# Patient Record
Sex: Female | Born: 1970 | Race: White | Hispanic: No | Marital: Married | Smoking: Never smoker
Health system: Southern US, Community
[De-identification: ages and names within clinical notes are randomized; demographics above are authoritative.]

---

## 2014-08-01 ENCOUNTER — Encounter (HOSPITAL_COMMUNITY): Payer: Self-pay | Admitting: Emergency Medicine

## 2014-08-01 ENCOUNTER — Emergency Department (HOSPITAL_COMMUNITY): Payer: Self-pay

## 2014-08-01 ENCOUNTER — Emergency Department (HOSPITAL_COMMUNITY)
Admission: EM | Admit: 2014-08-01 | Discharge: 2014-08-03 | Disposition: A | Discharge status: Other Acute Inpatient Hospital | Payer: Self-pay | Attending: Emergency Medicine | Admitting: Emergency Medicine

## 2014-08-01 DIAGNOSIS — F419 Anxiety disorder, unspecified: Secondary | ICD-10-CM | POA: Insufficient documentation

## 2014-08-01 DIAGNOSIS — Y998 Other external cause status: Secondary | ICD-10-CM | POA: Insufficient documentation

## 2014-08-01 DIAGNOSIS — Y9389 Activity, other specified: Secondary | ICD-10-CM | POA: Insufficient documentation

## 2014-08-01 DIAGNOSIS — F309 Manic episode, unspecified: Secondary | ICD-10-CM

## 2014-08-01 DIAGNOSIS — S52611A Displaced fracture of right ulna styloid process, initial encounter for closed fracture: Secondary | ICD-10-CM | POA: Insufficient documentation

## 2014-08-01 DIAGNOSIS — Y9289 Other specified places as the place of occurrence of the external cause: Secondary | ICD-10-CM | POA: Insufficient documentation

## 2014-08-01 DIAGNOSIS — F29 Unspecified psychosis not due to a substance or known physiological condition: Secondary | ICD-10-CM | POA: Insufficient documentation

## 2014-08-01 DIAGNOSIS — X58XXXA Exposure to other specified factors, initial encounter: Secondary | ICD-10-CM | POA: Insufficient documentation

## 2014-08-01 LAB — URINALYSIS, ROUTINE W REFLEX MICROSCOPIC
Bilirubin Urine: NEGATIVE
Glucose, UA: >1000 mg/dL — AB
Ketones, ur: NEGATIVE mg/dL
LEUKOCYTES UA: NEGATIVE
NITRITE: NEGATIVE
SPECIFIC GRAVITY, URINE: 1.02 (ref 1.005–1.030)
Urobilinogen, UA: 0.2 mg/dL (ref 0.0–1.0)
pH: 6 (ref 5.0–8.0)

## 2014-08-01 LAB — RAPID URINE DRUG SCREEN, HOSP PERFORMED
AMPHETAMINES: NOT DETECTED
BENZODIAZEPINES: NOT DETECTED
Barbiturates: NOT DETECTED
Cocaine: NOT DETECTED
Opiates: NOT DETECTED
TETRAHYDROCANNABINOL: POSITIVE — AB

## 2014-08-01 LAB — URINE MICROSCOPIC-ADD ON

## 2014-08-01 LAB — CBC WITH DIFFERENTIAL/PLATELET
Basophils Absolute: 0 10*3/uL (ref 0.0–0.1)
Basophils Relative: 0 % (ref 0–1)
EOS ABS: 0 10*3/uL (ref 0.0–0.7)
Eosinophils Relative: 0 % (ref 0–5)
HCT: 38.2 % (ref 36.0–46.0)
Hemoglobin: 12.8 g/dL (ref 12.0–15.0)
LYMPHS PCT: 11 % — AB (ref 12–46)
Lymphs Abs: 1.6 10*3/uL (ref 0.7–4.0)
MCH: 30.3 pg (ref 26.0–34.0)
MCHC: 33.5 g/dL (ref 30.0–36.0)
MCV: 90.5 fL (ref 78.0–100.0)
Monocytes Absolute: 0.6 10*3/uL (ref 0.1–1.0)
Monocytes Relative: 5 % (ref 3–12)
NEUTROS PCT: 84 % — AB (ref 43–77)
Neutro Abs: 11.7 10*3/uL — ABNORMAL HIGH (ref 1.7–7.7)
PLATELETS: 291 10*3/uL (ref 150–400)
RBC: 4.22 MIL/uL (ref 3.87–5.11)
RDW: 13.5 % (ref 11.5–15.5)
WBC: 13.9 10*3/uL — ABNORMAL HIGH (ref 4.0–10.5)

## 2014-08-01 LAB — COMPREHENSIVE METABOLIC PANEL
ALBUMIN: 3.8 g/dL (ref 3.5–5.2)
ALK PHOS: 98 U/L (ref 39–117)
ALT: 22 U/L (ref 0–35)
ANION GAP: 6 (ref 5–15)
AST: 24 U/L (ref 0–37)
BUN: 8 mg/dL (ref 6–23)
CO2: 23 mmol/L (ref 19–32)
Calcium: 8.5 mg/dL (ref 8.4–10.5)
Chloride: 105 mmol/L (ref 96–112)
Creatinine, Ser: 0.86 mg/dL (ref 0.50–1.10)
GFR calc Af Amer: >90 mL/min (ref >90)
GFR calc non Af Amer: 80 mL/min — ABNORMAL LOW (ref >90)
Glucose, Bld: 276 mg/dL — ABNORMAL HIGH (ref 70–99)
POTASSIUM: 3.1 mmol/L — AB (ref 3.5–5.1)
Sodium: 134 mmol/L — ABNORMAL LOW (ref 135–145)
Total Bilirubin: 0.8 mg/dL (ref 0.3–1.2)
Total Protein: 7.3 g/dL (ref 6.0–8.3)

## 2014-08-01 LAB — ETHANOL: Alcohol, Ethyl (B): <5 mg/dL (ref 0–9)

## 2014-08-01 LAB — CBG MONITORING, ED: Glucose-Capillary: 269 mg/dL — ABNORMAL HIGH (ref 70–99)

## 2014-08-01 MED ORDER — POTASSIUM CHLORIDE CRYS ER 20 MEQ PO TBCR
40.0000 meq | EXTENDED_RELEASE_TABLET | Freq: Once | ORAL | Status: AC
  Administered 2014-08-01: 40 meq via ORAL
  Filled 2014-08-01: qty 2

## 2014-08-01 MED ORDER — STERILE WATER FOR INJECTION IJ SOLN
INTRAMUSCULAR | Status: AC
  Filled 2014-08-01: qty 10

## 2014-08-01 MED ORDER — ZIPRASIDONE MESYLATE 20 MG IM SOLR
20.0000 mg | Freq: Once | INTRAMUSCULAR | Status: AC
  Administered 2014-08-01: 20 mg via INTRAMUSCULAR
  Filled 2014-08-01: qty 20

## 2014-08-01 MED ORDER — STERILE WATER FOR INJECTION IJ SOLN
INTRAMUSCULAR | Status: AC
  Administered 2014-08-01: 12:00:00
  Filled 2014-08-01: qty 10

## 2014-08-01 MED ORDER — LORAZEPAM 2 MG/ML IJ SOLN
1.0000 mg | Freq: Once | INTRAMUSCULAR | Status: AC
  Administered 2014-08-01: 1 mg via INTRAMUSCULAR
  Filled 2014-08-01: qty 1

## 2014-08-01 NOTE — ED Notes (Signed)
Telepsych completed. Patient placed in paper scrubs.  Room was stripped prior to placing patient in it.

## 2014-08-01 NOTE — ED Notes (Signed)
[facility].  Asked that he visit briefly then go home.  Prov[facility] and [facility].  He is agreeable and appreciative for the assistance.
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
## 2014-08-01 NOTE — ED Notes (Signed)
PT stated she hit her right hand this morning and was throwing stuff around her house bc she was upset. PT also c/o dental pain to front teeth.

## 2014-08-01 NOTE — Progress Notes (Signed)
CSW pursued inpatient placement for patient.  The following hospitals were contated.  [facility]  [facility]  Patient will need continued inpatient placement.   Adelene AmasEdith Corin Tilly, LCSW Disposition Social Worker [phone]
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
## 2014-08-01 NOTE — BHH Counselor (Signed)
Rosey BathKelly Southard, [facility], confirms adult acute unit is at capacity. Contacted the following facilities for placement:  BED AVAILABLE, FAXED CLINICAL INFORMATION: [facility], per Casimiro NeedleMichael  INFORMATION ALREADY FAXED, AWAITING RESPONSE: [facility]  AT CAPACITY: [facility], [facility], per [facility], per [facility], per [facility], per [facility], per [facility], per Peyton NajjarLarry  NO RESPONSE: [facility]   [address]Charlesetta Milliron Ellis Patsy BaltimoreWarrick Jr, WisconsinLPC, [facility] Triage Specialist [phone]
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
## 2014-08-01 NOTE — ED Notes (Signed)
Melba CoonWinnie, AC, called for sitter.

## 2014-08-01 NOTE — ED Notes (Signed)
While waiting after x-ray pt started yelling and becoming aggressive, PA went to bedside to evaluate pt and feels she will also require a BH eval.

## 2014-08-02 NOTE — Progress Notes (Signed)
CSW continued inpatient placement for patient. The following hospitals were contacted from previous referrals made yesterday by this CSW.   [facility]  The following hospitals reported has not yet been able to review outside referrals at this time.    Patient will need continued inpatient placement.  Adelene AmasEdith Aly Seidenberg, LCSW Disposition Social Worker [phone]

## 2014-08-02 NOTE — ED Notes (Signed)
Pt walking out of room looking around, acting confused. Pt was redirected to her room.

## 2014-08-02 NOTE — ED Notes (Signed)
Pt took shower and given clean pair of scrubs and fresh socks. Oral hygiene completed as well.
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
## 2014-08-02 NOTE — ED Notes (Signed)
Pt out walking in hall.  Pt easily directed to room.

## 2014-08-02 NOTE — Progress Notes (Signed)
[facility] states patient has not been reviewed as of yet.  Continues to pend for [facility] as well as previous placement.  Adelene AmasEdith Serria Sloma, LCSW Disposition Social Worker [phone]

## 2014-08-03 ENCOUNTER — Inpatient Hospital Stay (HOSPITAL_COMMUNITY)
Admission: EM | Admit: 2014-08-03 | Discharge: 2014-08-12 | DRG: 885 | Disposition: A | Discharge status: Home / Self Care | Payer: Federal, State, Local not specified - Other | Source: Intra-hospital | Attending: Psychiatry | Admitting: Psychiatry

## 2014-08-03 ENCOUNTER — Encounter (HOSPITAL_COMMUNITY): Payer: Self-pay | Admitting: *Deleted

## 2014-08-03 DIAGNOSIS — F319 Bipolar disorder, unspecified: Secondary | ICD-10-CM | POA: Diagnosis present

## 2014-08-03 DIAGNOSIS — Z6841 Body Mass Index (BMI) 40.0 and over, adult: Secondary | ICD-10-CM

## 2014-08-03 DIAGNOSIS — Z23 Encounter for immunization: Secondary | ICD-10-CM

## 2014-08-03 DIAGNOSIS — E669 Obesity, unspecified: Secondary | ICD-10-CM | POA: Diagnosis present

## 2014-08-03 DIAGNOSIS — S52613A Displaced fracture of unspecified ulna styloid process, initial encounter for closed fracture: Secondary | ICD-10-CM | POA: Diagnosis present

## 2014-08-03 DIAGNOSIS — F411 Generalized anxiety disorder: Secondary | ICD-10-CM | POA: Diagnosis present

## 2014-08-03 DIAGNOSIS — E119 Type 2 diabetes mellitus without complications: Secondary | ICD-10-CM | POA: Diagnosis present

## 2014-08-03 DIAGNOSIS — F29 Unspecified psychosis not due to a substance or known physiological condition: Secondary | ICD-10-CM | POA: Diagnosis present

## 2014-08-03 DIAGNOSIS — F1220 Cannabis dependence, uncomplicated: Secondary | ICD-10-CM | POA: Diagnosis present

## 2014-08-03 DIAGNOSIS — F1994 Other psychoactive substance use, unspecified with psychoactive substance-induced mood disorder: Secondary | ICD-10-CM | POA: Diagnosis present

## 2014-08-03 DIAGNOSIS — F19929 Other psychoactive substance use, unspecified with intoxication, unspecified: Secondary | ICD-10-CM | POA: Diagnosis present

## 2014-08-03 DIAGNOSIS — R9431 Abnormal electrocardiogram [ECG] [EKG]: Secondary | ICD-10-CM

## 2014-08-03 MED ORDER — HYDROXYZINE HCL 25 MG PO TABS
25.0000 mg | ORAL_TABLET | Freq: Four times a day (QID) | ORAL | Status: DC | PRN
  Administered 2014-08-03 – 2014-08-06 (×6): 25 mg via ORAL
  Filled 2014-08-03 (×6): qty 1

## 2014-08-03 MED ORDER — METFORMIN HCL 500 MG PO TABS
500.0000 mg | ORAL_TABLET | Freq: Two times a day (BID) | ORAL | Status: DC
  Administered 2014-08-04 – 2014-08-12 (×16): 500 mg via ORAL
  Filled 2014-08-03 (×3): qty 1
  Filled 2014-08-03: qty 28
  Filled 2014-08-03 (×10): qty 1
  Filled 2014-08-03: qty 28
  Filled 2014-08-03 (×8): qty 1

## 2014-08-03 MED ORDER — IBUPROFEN 600 MG PO TABS
600.0000 mg | ORAL_TABLET | Freq: Four times a day (QID) | ORAL | Status: DC | PRN
  Administered 2014-08-06 – 2014-08-11 (×6): 600 mg via ORAL
  Filled 2014-08-03 (×7): qty 1

## 2014-08-03 MED ORDER — MAGNESIUM HYDROXIDE 400 MG/5ML PO SUSP
30.0000 mL | Freq: Every day | ORAL | Status: DC | PRN
  Administered 2014-08-05 – 2014-08-08 (×3): 30 mL via ORAL
  Filled 2014-08-03 (×3): qty 30

## 2014-08-03 MED ORDER — ONDANSETRON HCL 4 MG PO TABS
4.0000 mg | ORAL_TABLET | Freq: Three times a day (TID) | ORAL | Status: DC | PRN
  Administered 2014-08-03: 4 mg via ORAL
  Filled 2014-08-03: qty 1

## 2014-08-03 MED ORDER — ZOLPIDEM TARTRATE 5 MG PO TABS
5.0000 mg | ORAL_TABLET | Freq: Once | ORAL | Status: AC
  Administered 2014-08-03: 5 mg via ORAL
  Filled 2014-08-03: qty 1

## 2014-08-03 MED ORDER — INSULIN ASPART 100 UNIT/ML ~~LOC~~ SOLN
0.0000 [IU] | Freq: Three times a day (TID) | SUBCUTANEOUS | Status: DC
Start: 2014-08-04 — End: 2014-08-12
  Administered 2014-08-04: 7 [IU] via SUBCUTANEOUS
  Administered 2014-08-04 (×2): 4 [IU] via SUBCUTANEOUS
  Administered 2014-08-05: 3 [IU] via SUBCUTANEOUS
  Administered 2014-08-05: 11 [IU] via SUBCUTANEOUS
  Administered 2014-08-05 – 2014-08-09 (×11): 4 [IU] via SUBCUTANEOUS
  Administered 2014-08-10: 3 [IU] via SUBCUTANEOUS
  Administered 2014-08-10 – 2014-08-11 (×2): 4 [IU] via SUBCUTANEOUS
  Administered 2014-08-11 – 2014-08-12 (×2): 3 [IU] via SUBCUTANEOUS

## 2014-08-03 MED ORDER — INFLUENZA VAC SPLIT QUAD 0.5 ML IM SUSY
0.5000 mL | PREFILLED_SYRINGE | INTRAMUSCULAR | Status: AC
  Administered 2014-08-04: 0.5 mL via INTRAMUSCULAR
  Filled 2014-08-03: qty 0.5

## 2014-08-03 MED ORDER — IBUPROFEN 400 MG PO TABS: 600.0000 mg | ORAL_TABLET | Freq: Three times a day (TID) | ORAL | Status: DC | PRN

## 2014-08-03 MED ORDER — INSULIN GLARGINE 100 UNIT/ML ~~LOC~~ SOLN
6.0000 [IU] | Freq: Every day | SUBCUTANEOUS | Status: DC
  Administered 2014-08-03 – 2014-08-11 (×9): 6 [IU] via SUBCUTANEOUS
  Filled 2014-08-03: qty 0.06

## 2014-08-03 MED ORDER — ALUM & MAG HYDROXIDE-SIMETH 200-200-20 MG/5ML PO SUSP
30.0000 mL | ORAL | Status: DC | PRN
  Administered 2014-08-12: 30 mL via ORAL
  Filled 2014-08-03: qty 30

## 2014-08-03 MED ORDER — ZOLPIDEM TARTRATE 5 MG PO TABS: 5.0000 mg | ORAL_TABLET | Freq: Every evening | ORAL | Status: DC | PRN

## 2014-08-03 MED ORDER — ACETAMINOPHEN 325 MG PO TABS
650.0000 mg | ORAL_TABLET | Freq: Four times a day (QID) | ORAL | Status: DC | PRN
  Administered 2014-08-05 – 2014-08-11 (×2): 650 mg via ORAL
  Filled 2014-08-03 (×2): qty 2

## 2014-08-03 MED ORDER — LORAZEPAM 1 MG PO TABS
1.0000 mg | ORAL_TABLET | Freq: Three times a day (TID) | ORAL | Status: DC | PRN
  Administered 2014-08-03 (×2): 1 mg via ORAL
  Filled 2014-08-03 (×2): qty 1

## 2014-08-03 MED ORDER — ALUM & MAG HYDROXIDE-SIMETH 200-200-20 MG/5ML PO SUSP
30.0000 mL | ORAL | Status: DC | PRN
Start: 2014-08-03 — End: 2014-08-03

## 2014-08-03 MED ORDER — TRAZODONE HCL 50 MG PO TABS
50.0000 mg | ORAL_TABLET | Freq: Every evening | ORAL | Status: DC | PRN
  Administered 2014-08-03 – 2014-08-04 (×3): 50 mg via ORAL
  Filled 2014-08-03 (×8): qty 1

## 2014-08-03 NOTE — Progress Notes (Signed)
Pt referral faxed to the following facilities who reported that they had beds available:  [facility]   [facility]  Will continue to seek placement.   Chad CordialLauren Carter, LCSWA 08/03/2014 2:02 PM

## 2014-08-03 NOTE — ED Notes (Signed)
Patient requesting something to help her sleep. Dr. Juleen ChinaKohut notified. Order for Remus Lofflerambien being placed.

## 2014-08-03 NOTE — Significant Event (Cosign Needed)
Defer antipsychotic Rx at this time due to noted prolonged QT interval on EKG of 452
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
## 2014-08-03 NOTE — ED Notes (Signed)
In reviewing patient's paperwork, this nurse found that IVC paperwork that was filled out on patient by Dr. Fayrene FearingJames on 08/01/14 had not be notarized. Paperwork also had not been faxed to magistrate and no 7 day paperwork has been received on patient. Therefore, patient is voluntary and no IVC paperwork on patient at this time. Dr. Juleen [facility]Kohut and [facility] notified. Dr. Juleen ChinaKohut stated he was not going to IVC patient at this time due to patient being calm and cooperative.

## 2014-08-03 NOTE — ED Notes (Signed)
Consent for voluntary admission and consent for treatment [facility]. Conformation fax received.

## 2014-08-03 NOTE — ED Notes (Signed)
[name] BED 505-2 by Dr. Elna BreslowEappen, after 1900 today.
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
## 2014-08-03 NOTE — [facility] Counselor (Signed)
Per Jaime(RN), states pt.'s IVC legals are invalid because petition was never faxed to magistrate.  Jamie A.(RN) spoke with Dr. Juleen ChinaKohut, who states that pt has been cooperative and doesn't feel that pt needs to be petitioned at this time.  Pt is now voluntary.  TTS will continue to seek placement.  Pt will continued to be observed and if commitment is needed then legals will be appropriately handled.

## 2014-08-04 ENCOUNTER — Encounter (HOSPITAL_COMMUNITY): Payer: Self-pay | Admitting: Psychiatry

## 2014-08-04 DIAGNOSIS — F29 Unspecified psychosis not due to a substance or known physiological condition: Principal | ICD-10-CM

## 2014-08-04 DIAGNOSIS — F1220 Cannabis dependence, uncomplicated: Secondary | ICD-10-CM | POA: Diagnosis present

## 2014-08-04 LAB — BASIC METABOLIC PANEL
ANION GAP: 9 (ref 5–15)
BUN: 17 mg/dL (ref 6–23)
CHLORIDE: 103 mmol/L (ref 96–112)
CO2: 24 mmol/L (ref 19–32)
CREATININE: 0.82 mg/dL (ref 0.50–1.10)
Calcium: 9.3 mg/dL (ref 8.4–10.5)
GFR calc Af Amer: >90 mL/min (ref >90)
GFR, EST NON AFRICAN AMERICAN: 85 mL/min — AB (ref >90)
Glucose, Bld: 177 mg/dL — ABNORMAL HIGH (ref 70–99)
POTASSIUM: 3.9 mmol/L (ref 3.5–5.1)
Sodium: 136 mmol/L (ref 135–145)

## 2014-08-04 LAB — URINE CULTURE

## 2014-08-04 LAB — GLUCOSE, CAPILLARY
GLUCOSE-CAPILLARY: 200 mg/dL — AB (ref 70–99)
GLUCOSE-CAPILLARY: 177 mg/dL — AB (ref 70–99)
GLUCOSE-CAPILLARY: 163 mg/dL — AB (ref 70–99)
GLUCOSE-CAPILLARY: 172 mg/dL — AB (ref 70–99)
Glucose-Capillary: 233 mg/dL — ABNORMAL HIGH (ref 70–99)

## 2014-08-04 LAB — LIPID PANEL
CHOLESTEROL: 153 mg/dL (ref 0–200)
HDL: 47 mg/dL (ref >39)
LDL Cholesterol: 62 mg/dL (ref 0–99)
Total CHOL/HDL Ratio: 3.3 RATIO
Triglycerides: 221 mg/dL — ABNORMAL HIGH (ref <150)
VLDL: 44 mg/dL — AB (ref 0–40)

## 2014-08-04 LAB — TSH: TSH: 1.538 u[IU]/mL (ref 0.350–4.500)

## 2014-08-04 LAB — HEMOGLOBIN A1C
Hgb A1c MFr Bld: 7.6 % — ABNORMAL HIGH (ref 4.8–5.6)
MEAN PLASMA GLUCOSE: 171 mg/dL

## 2014-08-04 LAB — MAGNESIUM: Magnesium: 2 mg/dL (ref 1.5–2.5)

## 2014-08-04 MED ORDER — HALOPERIDOL 5 MG PO TABS: 5.0000 mg | ORAL_TABLET | Freq: Two times a day (BID) | ORAL | Status: DC

## 2014-08-04 MED ORDER — BENZTROPINE MESYLATE 0.5 MG PO TABS: 0.5000 mg | ORAL_TABLET | Freq: Two times a day (BID) | ORAL | Status: DC

## 2014-08-04 MED ORDER — DIVALPROEX SODIUM 250 MG PO DR TAB
250.0000 mg | DELAYED_RELEASE_TABLET | Freq: Two times a day (BID) | ORAL | Status: DC
  Administered 2014-08-04 – 2014-08-05 (×2): 250 mg via ORAL
  Filled 2014-08-04 (×6): qty 1

## 2014-08-04 MED ORDER — NYSTATIN 100000 UNIT/GM EX POWD
Freq: Two times a day (BID) | CUTANEOUS | Status: DC
  Administered 2014-08-04 – 2014-08-12 (×15): via TOPICAL
  Filled 2014-08-04 (×2): qty 15
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
## 2014-08-04 NOTE — Progress Notes (Signed)
Adult Psychoeducational Group Note  Date:  08/04/2014 Time:  9:43 PM  Group Topic/Focus:  Wrap-Up Group:   The focus of this group is to help patients review their daily goal of treatment and discuss progress on daily workbooks.  Participation Level:  None  Participation Quality:  Intrusive  Affect:  Labile  Cognitive:  Lacking  Insight: None  Engagement in Group:  Distracting  Modes of Intervention:  Socialization and Support  Additional Comments:  Patient attended, however, got upset when asked about her day she stated she had a passionate day and need to be excuse from group. He got up and stormed out of group. She continue to scream and she walk down the hallway.   Cindy Bird, Cindy Bird 08/04/2014, 9:43 PM

## 2014-08-04 NOTE — [facility] Group Notes (Signed)
[facility] LCSW Group Therapy  08/04/2014 , 3:22 PM   Type of Therapy:  Group Therapy  Participation Level:  Invited.  Asked if it was mandatory.  I said yes.  She did not come.  Summary of Progress/Problems: Today's group focused on the term Diagnosis.  Participants were asked to define the term, and then pronounce whether it is a negative, positive or neutral term.  Daryel Geraldorth, Dayn Barich B 08/04/2014 , 3:22 PM

## 2014-08-04 NOTE — H&P (Signed)
Psychiatric Admission Assessment Adult  Patient Identification: Cindy Bird MRN:  [id number] Date of Evaluation:  08/04/2014 Chief Complaint:  psychotic disorder Principal Diagnosis: <principal problem not specified> Diagnosis:   Patient Active Problem List   Diagnosis Date Noted  . Psychosis [F29] 08/03/2014   History of Present Illness: Cindy Bird is a 44 year old Caucasian female. Admitted to [facility] from the [facility] ED. She reports, "My husband took me to the [facility] on the 6th of this month. I was with my husband driving, a neighbor of mine called me about this child in her house. I was gonna pick this child up & take him to church. He is 53 & his name is Doren Custard. Doren Custard is a sweet young man. When I went to pick him up from my neighbor's house, I was upset. I was playing the radio, I could hear this black lady sing. I started crying, but it was tears of joy. When I got into this house, Ms. Stanton Kidney was sitting down with medicines in front of her. I did not know if the medicines were hers or phillips. When I picked phillip up, I did not bring him back on the agreed time. All she could have done is to call my home, or called the police to call me. I like people and I enjoy talking to people a lot".  O: Cindy Bird is assessed and evaluated. She is alert & verbal. Patient is oriented to person & place. Unaware of situation. She says she has not been in this hospital in the past. Cindy Bird has a brace to right hand, unable to provide the rationale for the brace use. She presents with some of disorientation, disorganized speech, tangential speech and increased speech. She does not appear to be in any apparent distress. She says she is concerned about her weight, would want to weigh a little less than what her weight is at this moment. She denies feeling or being depressed, denies any signs of anxiety, SIHI, AVH and or delusional thoughts.  Elements:  Location:  Mood disorder. Quality:   Erratic behavior, tangential speech, impulsicity. Severity:  Severe. Timing:  Happened Saturday morning. Duration:  Unknown. Context:  "Presented with some form of erratic behaviors".  Associated Signs/Symptoms:  Depression Symptoms:  Denies any symptoms of depression  (Hypo) Manic Symptoms:  Elevated Mood, Impulsivity, Labiality of Mood,  Anxiety Symptoms:  Denies any symptoms of anxiety  Psychotic Symptoms:  Denies  PTSD Symptoms: None reported  Total Time spent with patient: 1 hour  Past Medical History: History reviewed. No pertinent past medical history. History reviewed. No pertinent past surgical history. Family History: History reviewed. No pertinent family history. Social History:  History  Alcohol Use No     History  Drug Use  . Yes  . Special: Marijuana    Comment: today    History   Social History  . Marital Status: Married    Spouse Name: N/A    Number of Children: N/A  . Years of Education: N/A   Social History Main Topics  . Smoking status: Never Smoker   . Smokeless tobacco: None  . Alcohol Use: No  . Drug Use: Yes    Special: Marijuana     Comment: today  . Sexual Activity: Yes    Birth Control/ Protection: None   Other Topics Concern  . None   Social History Narrative   Additional Social History:    History of alcohol / drug use?: No history of  alcohol / drug abuse  Musculoskeletal: Strength & Muscle Tone: within normal limits Gait & Station: normal Patient leans: N/A  Psychiatric Specialty Exam: Physical Exam  Constitutional: She is oriented to person, place, and time. She appears well-developed and well-nourished.  HENT:  Head: Normocephalic.  Eyes: Pupils are equal, round, and reactive to light.  Neck: Normal range of motion.  Cardiovascular: Normal rate.   Respiratory: Effort normal.  GI: Soft.  Genitourinary:  Denies any issues in this area  Musculoskeletal: Normal range of motion.  Neurological: She is alert and  oriented to person, place, and time.  Skin: Skin is warm and dry.     Right hand splint  Psychiatric: Thought content normal. Her mood appears anxious (Denies). Her affect is labile and inappropriate. Her affect is not angry and not blunt. Her speech is rapid and/or pressured and tangential. She is hyperactive. Cognition and memory are impaired. She expresses impulsivity. She exhibits a depressed mood (Denies).    Review of Systems  Constitutional: Negative.   HENT: Negative.   Eyes: Negative.   Respiratory: Negative.   Cardiovascular: Negative.   Gastrointestinal: Negative.   Genitourinary: Negative.   Musculoskeletal: Positive for myalgias.       Patient is wearing a right hand brace   Skin: Negative.   Neurological: Negative.   Endo/Heme/Allergies: Negative.   Psychiatric/Behavioral: Positive for substance abuse (Cannabis dependence). Negative for depression, suicidal ideas and hallucinations. The patient is not nervous/anxious and does not have insomnia.     Blood pressure 97/60, pulse 100, temperature 97.9 F (36.6 C), temperature source Oral, resp. rate 20, height $RemoveBe'5\' 3"'BctMIkslJ$  (1.6 m), weight 105.235 kg (232 lb), last menstrual period 07/04/2014.Body mass index is 41.11 kg/(m^2).  General Appearance: Casual and obese  Eye Contact::  Fair  Speech:  Clear and Coherent  Volume:  Increased  Mood:  Denies any feeling or being depressed  Affect:  Labile  Thought Process:  Circumstantial, Tangential and illogical  Orientation:  Other:  Oriented to person & place  Thought Content:  Denies any hallucinations  Suicidal Thoughts:  No  Homicidal Thoughts:  No  Memory:  Impaired  Judgement:  Impaired  Insight:  Lacking  Psychomotor Activity:  Increased  Concentration:  Fair  Recall:  Poor  Fund of Knowledge:Poor  Language: Fair  Akathisia:  No  Handed:  Right  AIMS (if indicated):     Assets:  Desire for Improvement Social Support  ADL's:  Intact  Cognition: Impaired,  Moderate   Sleep:  Number of Hours: 2.5   Risk to Self: Is patient at risk for suicide?: No  Risk to Others: No  Prior Inpatient Therapy: Unsure, patient is a poor historian  Prior Outpatient Therapy: Unsure, patient is a poor historian  Alcohol Screening: Patient refused Alcohol Screening Tool: Yes 1. How often do you have a drink containing alcohol?: Never 9. Have you or someone else been injured as a result of your drinking?: No 10. Has a relative or friend or a doctor or another health worker been concerned about your drinking or suggested you cut down?: No Alcohol Use Disorder Identification Test Final Score (AUDIT): 0 Brief Intervention: Patient declined brief intervention  Allergies:  No Known Allergies Lab Results:  Results for orders placed or performed during the hospital encounter of 08/03/14 (from the past 48 hour(s))  Lipid panel, fasting     Status: Abnormal   Collection Time: 08/04/14  6:19 AM  Result Value Ref Range   Cholesterol 153 0 -  200 mg/dL   Triglycerides 221 (H) <150 mg/dL   HDL 47 >39 mg/dL   Total CHOL/HDL Ratio 3.3 RATIO   VLDL 44 (H) 0 - 40 mg/dL   LDL Cholesterol 62 0 - 99 mg/dL    Comment:        Total Cholesterol/HDL:CHD Risk Coronary Heart Disease Risk Table                     Men   Women  1/2 Average Risk   3.4   3.3  Average Risk       5.0   4.4  2 X Average Risk   9.6   7.1  3 X Average Risk  23.4   11.0        Use the calculated Patient Ratio above and the CHD Risk Table to determine the patient's CHD Risk.        ATP III CLASSIFICATION (LDL):  <100     mg/dL   Optimal  100-129  mg/dL   Near or Above                    Optimal  130-159  mg/dL   Borderline  160-189  mg/dL   High  >190     mg/dL   Very High Performed at [place] metabolic panel     Status: Abnormal   Collection Time: 08/04/14  6:19 AM  Result Value Ref Range   Sodium 136 135 - 145 mmol/L   Potassium 3.9 3.5 - 5.1 mmol/L   Chloride 103 96 - 112  mmol/L   CO2 24 19 - 32 mmol/L   Glucose, Bld 177 (H) 70 - 99 mg/dL   BUN 17 6 - 23 mg/dL   Creatinine, Ser 0.82 0.50 - 1.10 mg/dL   Calcium 9.3 8.4 - 10.5 mg/dL   GFR calc non Af Amer 85 (L) >90 mL/min   GFR calc Af Amer >90 >90 mL/min    Comment: (NOTE) The eGFR has been calculated using the CKD EPI equation. This calculation has not been validated in all clinical situations. eGFR's persistently <90 mL/min signify possible Chronic Kidney Disease.    Anion gap 9 5 - 15    Comment: Performed at [facility]  Magnesium     Status: None   Collection Time: 08/04/14  6:19 AM  Result Value Ref Range   Magnesium 2.0 1.5 - 2.5 mg/dL    Comment: Performed at [facility]   Current Medications: Current Facility-Administered Medications  Medication Dose Route Frequency Provider Last Rate Last Dose  . acetaminophen (TYLENOL) tablet 650 mg  650 mg Oral Q6H PRN Laverle Hobby, PA-C      . alum & mag hydroxide-simeth (MAALOX/MYLANTA) 200-200-20 MG/5ML suspension 30 mL  30 mL Oral Q4H PRN Laverle Hobby, PA-C      . hydrOXYzine (ATARAX/VISTARIL) tablet 25 mg  25 mg Oral Q6H PRN Laverle Hobby, PA-C   25 mg at 08/03/14 2333  . ibuprofen (ADVIL,MOTRIN) tablet 600 mg  600 mg Oral Q6H PRN Laverle Hobby, PA-C      . Influenza vac split quadrivalent PF (FLUARIX) injection 0.5 mL  0.5 mL Intramuscular Tomorrow-1000 Saramma Eappen, MD      . insulin aspart (novoLOG) injection 0-20 Units  0-20 Units Subcutaneous TID WC Laverle Hobby, PA-C   4 Units at 08/04/14 [id number]  . insulin glargine (LANTUS) injection 6 Units  6 Units Subcutaneous QHS Laverle Hobby, PA-C   6 Units at 08/03/14 2333  . magnesium hydroxide (MILK OF MAGNESIA) suspension 30 mL  30 mL Oral Daily PRN Laverle Hobby, PA-C      . metFORMIN (GLUCOPHAGE) tablet 500 mg  500 mg Oral BID WC Laverle Hobby, PA-C   500 mg at 08/04/14 0805  . nystatin (MYCOSTATIN/NYSTOP) topical powder   Topical BID Laverle Hobby, PA-C      . traZODone (DESYREL) tablet 50 mg  50 mg Oral QHS,MR X 1 Laverle Hobby, PA-C   50 mg at 08/03/14 2333   PTA Medications: Prescriptions prior to admission  Medication Sig Dispense Refill Last Dose  . Phenyleph-Doxylamine-DM-APAP (ALKA SELTZER PLUS PO) Take 1 capsule by mouth every 6 (six) hours as needed (cold/congestion).   Past Week at Unknown time   Previous Psychotropic Medications: Yes   Substance Abuse History in the last 12 months:  Yes.   (Smokes weed)  Consequences of Substance Abuse: Medical Consequences:  Liver damage, Possible death by overdose Legal Consequences:  Arrests, jail time, Loss of driving privilege. Family Consequences:  Family discord, divorce and or separation.  Results for orders placed or performed during the hospital encounter of 08/03/14 (from the past 72 hour(s))  Lipid panel, fasting     Status: Abnormal   Collection Time: 08/04/14  6:19 AM  Result Value Ref Range   Cholesterol 153 0 - 200 mg/dL   Triglycerides 221 (H) <150 mg/dL   HDL 47 >39 mg/dL   Total CHOL/HDL Ratio 3.3 RATIO   VLDL 44 (H) 0 - 40 mg/dL   LDL Cholesterol 62 0 - 99 mg/dL    Comment:        Total Cholesterol/HDL:CHD Risk Coronary Heart Disease Risk Table                     Men   Women  1/2 Average Risk   3.4   3.3  Average Risk       5.0   4.4  2 X Average Risk   9.6   7.1  3 X Average Risk  23.4   11.0        Use the calculated Patient Ratio above and the CHD Risk Table to determine the patient's CHD Risk.        ATP III CLASSIFICATION (LDL):  <100     mg/dL   Optimal  100-129  mg/dL   Near or Above                    Optimal  130-159  mg/dL   Borderline  160-189  mg/dL   High  >190     mg/dL   Very High Performed at [place] metabolic panel     Status: Abnormal   Collection Time: 08/04/14  6:19 AM  Result Value Ref Range   Sodium 136 135 - 145 mmol/L   Potassium 3.9 3.5 - 5.1 mmol/L   Chloride 103 96 - 112 mmol/L   CO2  24 19 - 32 mmol/L   Glucose, Bld 177 (H) 70 - 99 mg/dL   BUN 17 6 - 23 mg/dL   Creatinine, Ser 0.82 0.50 - 1.10 mg/dL   Calcium 9.3 8.4 - 10.5 mg/dL   GFR calc non Af Amer 85 (L) >90 mL/min   GFR calc Af Amer >90 >90 mL/min    Comment: (NOTE) The eGFR  has been calculated using the CKD EPI equation. This calculation has not been validated in all clinical situations. eGFR's persistently <90 mL/min signify possible Chronic Kidney Disease.    Anion gap 9 5 - 15    Comment: Performed at [facility]  Magnesium     Status: None   Collection Time: 08/04/14  6:19 AM  Result Value Ref Range   Magnesium 2.0 1.5 - 2.5 mg/dL    Comment: Performed at [facility]    Observation Level/Precautions:  15 minute checks  Laboratory:  Per ED  Psychotherapy: Group sessions    Medications: See medication lists    Consultations: As needed  Discharge Concerns: Mood stability  Estimated LOS: 5-7 days  Other:     Psychological Evaluations: Yes   Treatment Plan Summary: Daily contact with patient to assess and evaluate symptoms and progress in treatment and Medication management:  1. Admit for crisis management and stabilization, estimated length of stay 3-5 days.  2. Medication management to reduce current symptoms to base line and improve the patient's overall level of functioning  3. Treat health problems as indicated.  4. Develop treatment plan to decrease risk of relapse upon discharge and the need for readmission.  5. Psycho-social education regarding relapse prevention and self care.  6. Health care follow up as needed for medical problems.  7. Review, reconcile, and reinstate any pertinent home medications for other health issues where appropriate. 8. Call for consults with hospitalist for any additional specialty patient care services as needed.  Medical Decision Making:  New problem, with additional work up planned, Review of Psycho-Social Stressors (1),  Review or order clinical lab tests (1), Review and summation of old records (2), Review of Medication Regimen & Side Effects (2) and Review of New Medication or Change in Dosage (2)  I certify that inpatient services furnished can reasonably be expected to improve the patient's condition.   Encarnacion Slates, PMHNP, FNP-BC 2/9/20169:42 AM I personally assessed the patient, reviewed the physical exam and labs and formulated the treatment plan Geralyn Flash A. Sabra Heck, M.D.
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
## 2014-08-04 NOTE — Tx Team (Signed)
Initial Interdisciplinary Treatment Plan   PATIENT STRESSORS: Financial difficulties Marital or family conflict Substance abuse   PATIENT STRENGTHS: Capable of independent living General fund of knowledge Religious Affiliation Supportive family/friends   PROBLEM LIST: Problem List/Patient Goals Date to be addressed Date deferred Reason deferred Estimated date of resolution  "they said I needed special consideration" 08-03-14     "I was mad, people had to keep their distance" 08-03-14     Hx of THC use"I'm a green thing" 08-03-14     Psychosis 08-03-14     Anxiety 08-03-14     Client labs indicate high blood sugars 08-03-14     Hx of HTN 08-03-14                  DISCHARGE CRITERIA:  Adequate post-discharge living arrangements Improved stabilization in mood, thinking, and/or behavior Verbal commitment to aftercare and medication compliance  PRELIMINARY DISCHARGE PLAN: Outpatient therapy Return to previous living arrangement  PATIENT/FAMIILY INVOLVEMENT: This treatment plan has been presented to and reviewed with the patient, Cindy Bird, and/or family member.  The patient and family have been given the opportunity to ask questions and make suggestions.  Mickeal NeedyJohnson, Sanvi Ehler N 08/04/2014, 12:03 AM

## 2014-08-04 NOTE — Plan of Care (Signed)
Problem: Alteration in mood & ability to function due to Goal: STG-Patient will attend groups Pt attended groups as scheduled this AM. Continue to need support and reinforcement due to disorganized / tangential thought process.

## 2014-08-04 NOTE — [facility] Suicide Risk Assessment (Signed)
[facility] Admission Suicide Risk Assessment   Nursing information obtained from:  Patient Demographic factors:  Caucasian, Low socioeconomic status, Unemployed Current Mental Status:  NA Loss Factors:  Financial problems / change in socioeconomic status Historical Factors:  Family history of suicide Risk Reduction Factors:  Religious beliefs about death, Living with another person, especially a relative Total Time spent with patient: 30 minutes Principal Problem: Psychosis Diagnosis:   Patient Active Problem List   Diagnosis Date Noted  . Psychosis [F29] 08/04/2014  . Cannabis use disorder, severe, dependence [F12.20] 08/04/2014     Continued Clinical Symptoms:  Alcohol Use Disorder Identification Test Final Score (AUDIT): 0 The "Alcohol Use Disorders Identification Test", Guidelines for Use in Primary Care, Second Edition.  World Science writer [facility]). Score between 0-7:  no or low risk or alcohol related problems. Score between 8-15:  moderate risk of alcohol related problems. Score between 16-19:  high risk of alcohol related problems. Score 20 or above:  warrants further diagnostic evaluation for alcohol dependence and treatment.   CLINICAL FACTORS:   Alcohol/Substance Abuse/Dependencies Currently Psychotic   Musculoskeletal: Strength & Muscle Tone: within normal limits Gait & Station: normal Patient leans: N/A  Psychiatric Specialty Exam: Physical Exam  Review of Systems  Constitutional: Negative.   HENT: Negative.   Eyes: Negative.   Respiratory: Negative.   Cardiovascular: Negative.   Gastrointestinal: Negative.   Genitourinary: Negative.   Musculoskeletal: Negative.   Skin: Negative.   Psychiatric/Behavioral: Positive for substance abuse. Negative for hallucinations.    Blood pressure 97/60, pulse 100, temperature 97.9 F (36.6 C), temperature source Oral, resp. rate 20, height  (1.6 m), weight 105.235 kg (232 lb), last menstrual period 07/04/2014.Body  mass index is 41.11 kg/(m^2).  General Appearance: Disheveled  Eye Solicitor::  Fair  Speech:  Normal Rate  Volume:  Normal  Mood:  Anxious  Affect:  Appropriate  Thought Process:  Disorganized  Orientation:  Full (Time, Place, and Person)  Thought Content:  WDL  Suicidal Thoughts:  No  Homicidal Thoughts:  No  Memory:  Immediate;   Poor Recent;   Poor Remote;   Poor  Judgement:  Impaired  Insight:  Shallow  Psychomotor Activity:  Restlessness  Concentration:  Poor  Recall:  Fair  Fund of Knowledge:Fair  Language: Poor  Akathisia:  No  Handed:  Right  AIMS (if indicated):     Assets:  Social Support  Sleep:  Number of Hours: 2.5  Cognition: WNL  ADL's:  Intact     COGNITIVE FEATURES THAT CONTRIBUTE TO RISK:  Closed-mindedness, Polarized thinking and Thought constriction (tunnel vision)    SUICIDE RISK:   Mild:  Suicidal ideation of limited frequency, intensity, duration, and specificity.  There are no identifiable plans, no associated intent, mild dysphoria and related symptoms, good self-control (both objective and subjective assessment), few other risk factors, and identifiable protective factors, including available and accessible social support.  PLAN OF CARE: Patient will benefit from inpatient treatment and stabilization.  Estimated length of stay is 5-7 days.  Reviewed past medical records,treatment plan.  Will get collateral information since patient is very disorganized. Will start a trial of Haldol 5 mg po bid as well as Cogentin 0.5 mg po bid. Will continue to monitor vitals ,medication compliance and treatment side effects while patient is here.  Will monitor for medical issues as well as call consult as needed.  Reviewed labs ,will order TSH,lipid panel,Hba1c,EKG if not already done. CSW will start working on disposition.  Patient to participate in therapeutic milieu .       Medical Decision Making:  Review of Psycho-Social Stressors (1), Review or  order clinical lab tests (1), Review and summation of old records (2), Established Problem, Worsening (2), New Problem, with no additional work-up planned (3), Review or order medicine tests (1), Review of Medication Regimen & Side Effects (2) and Review of New Medication or Change in Dosage (2)  I certify that inpatient services furnished can reasonably be expected to improve the patient's condition.   Lamount Bankson MD 08/04/2014, 2:53 PM

## 2014-08-04 NOTE — Progress Notes (Signed)
D: Pt was observed yelling and being verbally abusive in her bedroom at approximately 1340. Prior to verbal outburst, she was seen interacting well with peers in dayroom during and after groups. Presents with congruent affect and pleasant mood at this time. However, pt is disorganized in thought process and tangential as well; as is unable to focus on topic during conversations / assessments.  A: All medications including PRN Vistaril for anxiety given as per MD's order. Baseline EKG done and given to NP for review prior to filling in pt's chart.  Availability and support offered. Q 15 minutes checks continues for suicide and pt monitored as such without gestures of self harm to note thus far this shift.  Right hand checked (splint site--Fracture), splint intact. Cap refill WNL. R: Pt cooperative with care. Compliant with current medication / treatment regimen. Pt denied SI / HI, verbally contracts for safety while hospitalized. Pt denied AVH when assessed.  Attended scheduled nursing group this AM. Splint intact to pt's right hand (fracture) and she is able to move her fingers without c/o pain. Pt. voiced no concerns to staff at this time. Safety maintained on / off unit.

## 2014-08-05 ENCOUNTER — Encounter (HOSPITAL_COMMUNITY): Payer: Self-pay | Admitting: Psychiatry

## 2014-08-05 LAB — GLUCOSE, CAPILLARY
GLUCOSE-CAPILLARY: 182 mg/dL — AB (ref 70–99)
Glucose-Capillary: 126 mg/dL — ABNORMAL HIGH (ref 70–99)
Glucose-Capillary: 268 mg/dL — ABNORMAL HIGH (ref 70–99)
Glucose-Capillary: 156 mg/dL — ABNORMAL HIGH (ref 70–99)

## 2014-08-05 MED ORDER — QUETIAPINE FUMARATE 25 MG PO TABS
25.0000 mg | ORAL_TABLET | Freq: Once | ORAL | Status: AC
  Administered 2014-08-05: 25 mg via ORAL
  Filled 2014-08-05 (×2): qty 1

## 2014-08-05 MED ORDER — DIVALPROEX SODIUM 500 MG PO DR TAB
500.0000 mg | DELAYED_RELEASE_TABLET | Freq: Two times a day (BID) | ORAL | Status: DC
  Administered 2014-08-05 – 2014-08-06 (×2): 500 mg via ORAL
  Filled 2014-08-05 (×7): qty 1

## 2014-08-05 MED ORDER — LORAZEPAM 1 MG PO TABS
2.0000 mg | ORAL_TABLET | Freq: Once | ORAL | Status: AC
  Administered 2014-08-05: 2 mg via ORAL

## 2014-08-05 MED ORDER — LORAZEPAM 1 MG PO TABS
ORAL_TABLET | ORAL | Status: AC
  Filled 2014-08-05: qty 2

## 2014-08-05 MED ORDER — POLYETHYLENE GLYCOL 3350 17 G PO PACK
17.0000 g | PACK | Freq: Every day | ORAL | Status: DC
  Administered 2014-08-05 – 2014-08-12 (×7): 17 g via ORAL
  Filled 2014-08-05 (×11): qty 1

## 2014-08-05 MED ORDER — ZOLPIDEM TARTRATE 5 MG PO TABS: 5.0000 mg | ORAL_TABLET | Freq: Every day | ORAL | Status: DC

## 2014-08-05 MED ORDER — QUETIAPINE FUMARATE 25 MG PO TABS
12.5000 mg | ORAL_TABLET | Freq: Every day | ORAL | Status: DC
  Administered 2014-08-05: 12.5 mg via ORAL
  Filled 2014-08-05 (×3): qty 0.5
  Filled 2014-08-05: qty 1

## 2014-08-05 NOTE — Progress Notes (Signed)
D: Pt observed in milieu at intervals throughout this shift. Verbal outburst X 1 this AM, pt stormed out of CSW group, yelling, slammed dayroom door, went to her room and was verbally abusive and tearful at the time.  A: Scheduled medications given including PRN Tylenol and Vistaril for pain and anxiety as per MD's orders. Pt also received Miralax X1 for c/o constipation. Continued support and redirection provided to pt. EKG repeated this shift as per order. Safety maintained on Q 15 minutes checks without gestures of self harm to note at this time.  R: Pt intrusive towards staff and peers. Verbal redirection effective. Affect congruent with labile mood. Pt remains med compliant, denied adverse drug reactions when assessed. Safety maintained on/off unit.
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
## 2014-08-05 NOTE — [facility] Group Notes (Signed)
[facility]   08/05/2014 3:48 PM  Participation Quality:  Engaged  Mood/Affect:  Irritable  Depression Rating:    Anxiety Rating:    Thoughts of Suicide:  No Will you contract for safety?   NA  Current AVH:  Denies  Plan for Discharge/Comments:  When invited, went into lengthy explanation about why she could not come.  I encouraged her, and she ended up coming.  Sat quietly until I called on her.  Then went into another long winded explanation about who's watching her brother that included a neighbor's history as a security guard and some of his adventures.  Although i allowed her ample time to share, she was offended when I cut her off and stormed out of the room. Transportation Means:   Supports:  Daryel GeraldNorth, Pat Sires B

## 2014-08-05 NOTE — Progress Notes (Signed)
Adult Psychoeducational Group Note  Date:  08/05/2014 Time:  10:30 PM  Group Topic/Focus:  Wrap-Up Group:   The focus of this group is to help patients review their daily goal of treatment and discuss progress on daily workbooks.  Participation Level:  Active  Participation Quality:  Appropriate  Affect:  Labile  Cognitive:  Oriented  Insight: Limited  Engagement in Group:  Limited  Modes of Intervention:  Socialization and Support  Additional Comments:  Patient attended and participated in group tonight. She reports that her day was wonderful, and crazy. She went for her groups, and meals were brought back to her. The patient was all over the place she has to be redirected several times. Today she talked with her husband and her brother.  Lita MainsFrancis, Dyan Labarbera [facility]CDacosta 08/05/2014, 10:30 PM

## 2014-08-05 NOTE — Plan of Care (Signed)
Problem: Alteration in mood Goal: STG-Patient reports thoughts of self-harm to staff Outcome: Progressing Pt denied thoughts of self harm when assessed this shift. No gestures / event of self injurious behavior to note thus far.

## 2014-08-05 NOTE — [facility] Group Notes (Signed)
[facility] LCSW Group Therapy  08/05/2014 1:37 PM  Type of Therapy:  Group Therapy  Participation Level:  Did Not Attend  Modes of Intervention:  Discussion, Socialization and Support  Summary of Progress/Problems: [facility]) speaker came to talk about his personal journey with substance abuse and mental illness. Group members were challenged to process ways by which to relate to the speaker. [facility] speaker provided handouts and educational information pertaining to groups and services offered by [facility].    Bird,Cindy 08/05/2014, 1:37 PM

## 2014-08-06 DIAGNOSIS — E119 Type 2 diabetes mellitus without complications: Secondary | ICD-10-CM

## 2014-08-06 DIAGNOSIS — F319 Bipolar disorder, unspecified: Secondary | ICD-10-CM | POA: Diagnosis present

## 2014-08-06 DIAGNOSIS — F1490 Cocaine use, unspecified, uncomplicated: Secondary | ICD-10-CM

## 2014-08-06 DIAGNOSIS — S52613A Displaced fracture of unspecified ulna styloid process, initial encounter for closed fracture: Secondary | ICD-10-CM | POA: Diagnosis present

## 2014-08-06 LAB — GLUCOSE, CAPILLARY
GLUCOSE-CAPILLARY: 199 mg/dL — AB (ref 70–99)
Glucose-Capillary: 164 mg/dL — ABNORMAL HIGH (ref 70–99)
Glucose-Capillary: 183 mg/dL — ABNORMAL HIGH (ref 70–99)
Glucose-Capillary: 172 mg/dL — ABNORMAL HIGH (ref 70–99)

## 2014-08-06 MED ORDER — QUETIAPINE FUMARATE 50 MG PO TABS
50.0000 mg | ORAL_TABLET | Freq: Once | ORAL | Status: DC
  Filled 2014-08-06: qty 1

## 2014-08-06 MED ORDER — QUETIAPINE FUMARATE ER 50 MG PO TB24
50.0000 mg | ORAL_TABLET | Freq: Every day | ORAL | Status: DC
  Administered 2014-08-06 – 2014-08-07 (×2): 50 mg via ORAL
  Filled 2014-08-06 (×4): qty 1

## 2014-08-06 MED ORDER — HYDROXYZINE HCL 50 MG PO TABS
50.0000 mg | ORAL_TABLET | Freq: Three times a day (TID) | ORAL | Status: DC | PRN
  Administered 2014-08-06 – 2014-08-10 (×5): 50 mg via ORAL
  Filled 2014-08-06 (×5): qty 1

## 2014-08-06 MED ORDER — DIVALPROEX SODIUM 500 MG PO DR TAB
500.0000 mg | DELAYED_RELEASE_TABLET | Freq: Two times a day (BID) | ORAL | Status: DC
  Administered 2014-08-06 – 2014-08-09 (×6): 500 mg via ORAL
  Filled 2014-08-06 (×10): qty 1

## 2014-08-06 MED ORDER — GABAPENTIN 100 MG PO CAPS
200.0000 mg | ORAL_CAPSULE | Freq: Three times a day (TID) | ORAL | Status: DC
  Administered 2014-08-06 – 2014-08-07 (×2): 200 mg via ORAL
  Filled 2014-08-06 (×6): qty 2
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
## 2014-08-06 NOTE — Progress Notes (Signed)
  D: Patient has been labile on and off during the day. Argumentative and intrusive with other patients today. Yelling a lot in the afternoon. Needing constant redirection and give prn medication (Vistaril) this afternoon. Patient has it in her mind that she will be discharged tonight and yells when telling her that she cannot discharge tonight. A: Staff will continue to monitor on q 15 minute checks, follow treatment plan, and give meds as ordered. R: Still needing redirection

## 2014-08-06 NOTE — [facility] Group Notes (Signed)
[facility] Group Notes:  (Counselor/Nursing/MHT/Case Management/Adjunct)  08/06/2014 1:15PM  Type of Therapy:  Group Therapy  Participation Level:  Active  Participation Quality:  Appropriate  Affect:  Flat  Cognitive:  Oriented  Insight:  Improving  Engagement in Group:  Limited  Engagement in Therapy:  Limited  Modes of Intervention:  Discussion, Exploration and Socialization  Summary of Progress/Problems: The topic for group was balance in life.  Pt participated in the discussion about when their life was in balance and out of balance and how this feels.  Pt discussed ways to get back in balance and short term goals they can work on to get where they want to be. Came briefly.  Difficult time settling.  Had an outburst in hall just previous to coming into group.  Left after 5 minutes.  Came back.  Sat down in the leaders chair.  Was able to follow directions by coloring as instructed.  Began asking who leader's supervisor was and what pictures were being used for.  Not satisfied with answer.  Left and did not return.   Daryel Geraldorth, Nelva Hauk B 08/06/2014 1:02 PM

## 2014-08-06 NOTE — Progress Notes (Addendum)
Patient ID: Nance PearVirginia Noyce, female   DOB: June 29, 1970, 44 y.o.   MRN: [id number]  D: Patient calm on approach today but reports agitation. Conversation has flight of ideas and loose associations. Patient appears anxious and asking why undersigned was moving so much in the med room and then asking if I was mad at her. 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 Asking a lot of questions about medications which I went over with her. Handed in her self inventory but did not fill out. A: Staff will monitor on q 15 minutes, follow treatment plan, and give medications as ordered. R: Took meds without difficulty. Cooperative at present.

## 2014-08-06 NOTE — Progress Notes (Signed)
D: Pt denies SI/HI/AVH. Pt is very labile, intrusive, hyper-verbal, tangential, and argumentative. Pt was arguing on phone with "my husband" and was cursing loud and slamming phone down. When pt was confronted and asked to not curse on the unit and [facility] property, pt stormed off and blamed everyone from staff to her husband for her problems. Pt has no insight into her Tx, pt has no understanding of taking responsibility for her own actions.   A: Pt was offered support and encouragement. Pt was given scheduled medications. Pt was encourage to attend groups. Q 15 minute checks were done for safety.   R:Pt attends groups and interacts well with peers and staff. Pt is taking medication.Pt receptive to treatment and safety maintained on unit.

## 2014-08-06 NOTE — Progress Notes (Signed)
Pt has been labile, argumentative, flight of Ideas and delusional since before the beginning of the shift. Pt states "I don't want to leave "  On next breath then states "I can leave when I want". Pt continues to Cycle from calm to Agitated continuously". Pt continues to be disruptive to the milieu and has been causing other patients to feel anxious, irritated and agitated and scared.
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
## 2014-08-06 NOTE — Progress Notes (Signed)
The patient is yelling and screaming at the staff about having to wait to receive a gauze pad with some tape. This author encouraged the patient to ask her nurse and then she flew into a rage. She yelled at both of her nurses. The nurses were notified of the situation. The patient would like to apply gauze to her foot along with some tape.

## 2014-08-06 NOTE — [facility] Group Notes (Signed)
[facility] Group Notes:  (Nursing/MHT/Case Management/Adjunct)  Date:  08/06/2014  Time:  0900  Type of Therapy:  Nurse Education  Participation Level:  Active  Participation Quality:  Intrusive and Monopolizing  Affect:  Labile  Cognitive:  Disorganized  Insight:  Lacking  Engagement in Group:  Monopolizing and Off Topic  Modes of Intervention:  Problem-solving and Support  Summary of Progress/Problems:Set goals and went over unit rules  Cindy Bird, Cindy Bird 08/06/2014, 1:46 PM

## 2014-08-06 NOTE — Plan of Care (Signed)
Problem: Food- and Nutrition-Related Knowledge Deficit (NB-1.1) Goal: Nutrition education Formal process to instruct or train a patient/client in a skill or to impart knowledge to help patients/clients voluntarily manage or modify food choices and eating behavior to maintain or improve health. Outcome: Completed/Met Date Met:  08/06/14 Nutrition Education Note  RD consulted for nutrition education regarding hyperlipidemia.  Lipid Panel     Component Value Date/Time    CHOL 153 08/04/2014 0619    TRIG 221* 08/04/2014 0619    HDL 47 08/04/2014 0619    CHOLHDL 3.3 08/04/2014 0619    VLDL 44* 08/04/2014 0619    LDLCALC 62 08/04/2014 0619    RD provided "High Triglycerides Nutrition Therapy" handout from the Academy of [employer] Dietetics. Reviewed patient's dietary recall. Provided examples on ways to decrease sugar and fat intake in diet. Discouraged intake of processed foods and consumption of sugar-sweetened beverages. Encouraged fresh fruits and vegetables as well as whole grain sources of carbohydrates to maximize fiber intake. Teach back method used.  Expect poor compliance. Pt did not demonstrate that she understood the material. Pt was very distracted and continued to ramble and stayed off topic during education.  Body mass index is 41.11 kg/(m^2). Pt meets criteria for morbid obesity based on current BMI.  Diet Order: Diet Carb Modified Pt is also offered choice of unit snacks mid-morning and mid-afternoon.  Pt is eating as desired.   Labs and medications reviewed. No further nutrition interventions warranted at this time. If additional nutrition issues arise, please re-consult RD.  Clayton Bibles, MS, RD, LDN Pager: [phone] After Hours Pager: [phone]

## 2014-08-07 LAB — GLUCOSE, CAPILLARY
GLUCOSE-CAPILLARY: 214 mg/dL — AB (ref 70–99)
Glucose-Capillary: 143 mg/dL — ABNORMAL HIGH (ref 70–99)
Glucose-Capillary: 180 mg/dL — ABNORMAL HIGH (ref 70–99)
Glucose-Capillary: 158 mg/dL — ABNORMAL HIGH (ref 70–99)

## 2014-08-07 LAB — VALPROIC ACID LEVEL: Valproic Acid Lvl: 71.7 ug/mL (ref 50.0–100.0)

## 2014-08-07 MED ORDER — GABAPENTIN 300 MG PO CAPS
300.0000 mg | ORAL_CAPSULE | Freq: Three times a day (TID) | ORAL | Status: DC
  Administered 2014-08-07 – 2014-08-10 (×9): 300 mg via ORAL
  Filled 2014-08-07 (×18): qty 1

## 2014-08-07 NOTE — Progress Notes (Signed)
D: Pt denies SI/HI/AVH. Pt is labile, argumentative, flight of ideas, agitated, irritable, pressured speech. Pt complained that her feet were hurting and swollen. Pt wanted to wrap her feet in gauze. Pt continued to be somatic complaining that she was having cramps, foot pain, and numerous issues. Pt stated she wanted to go home and she was going to leave in one breath then stated the staff treats her so nice and she does not want to leave.   A: Pt was offered support and encouragement. Pt was given scheduled medications. Pt was encourage to attend groups. Q 15 minute checks were done for safety. Pt was informed to elevate feet and encouraged to take ibuprofen.   R:Pt attends groups  Pt is taking medication. Pt receptive to treatment and safety maintained on unit.

## 2014-08-07 NOTE — Progress Notes (Signed)
Psychoeducational Group Note  Date:  08/07/2014 Time:  0344  Group Topic/Focus:  Wrap-Up Group:   The focus of this group is to help patients review their daily goal of treatment and discuss progress on daily workbooks.  Participation Level: Did Not Attend  Participation Quality:  Not Applicable  Affect:  Not Applicable  Cognitive:  Not Applicable  Insight:  Not Applicable  Engagement in Group: Not Applicable  Additional Comments:  The patient did not attend last evening's Wrap-Up Group since she was out in the hallway talking to the nurse and eventually returned to her room.   Hazle CocaGOODMAN, Elen Acero S 08/07/2014, 3:44 AM

## 2014-08-07 NOTE — [facility] Group Notes (Signed)
Adult Psychoeducational Group Note  Date:  08/07/2014 Time:  9:59 PM  Group Topic/Focus:  Wrap-Up Group:   The focus of this group is to help patients review their daily goal of treatment and discuss progress on daily workbooks.  Participation Level:  Did Not Attend  Participation Quality:  None  Affect:  None  Cognitive:  None  Insight: None  Engagement in Group:  None  Modes of Intervention:  Discussion  Additional Comments:  [place] did not attend group.  Caroll RancherLindsay, Carloyn Lahue A 08/07/2014, 9:59 PM

## 2014-08-07 NOTE — Progress Notes (Signed)
Pt has been complaining about her feet and the socks causing "hot spots on her feet". Pt was informed to elevate her feet and to take advil to help reduce the swelling. Pt stated that won't work then started to talk very bizarre. Pt continues to talk pressured, rapid, argumentative, and very bizarre. Pt language is intangible at times.

## 2014-08-07 NOTE — Progress Notes (Signed)
D: Pt denies SI/HI/AVH. Pt continues to be tangential, labile, loud, intrusive, and rude. Pt is noticeably calmer than the past few evenings, but continues to be very explosive if something irritates her.    A: Pt was offered support and encouragement. Pt was given scheduled medications. Pt was encourage to attend groups. Q 15 minute checks were done for safety.   R:Pt attends groups and interacts well with peers and staff. Pt is taking medication. Pt receptive to treatment and safety maintained on unit.
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
## 2014-08-07 NOTE — [facility] Group Notes (Signed)
[facility] LCSW Group Therapy  08/07/2014 1:20 PM  Type of Therapy:  Group Therapy  Participation Level:  Active  Participation Quality:  Intrusive, Monopolizing   Affect:  Labile  Cognitive:  Disorganized and Confused  Insight:  Lacking  Engagement in Therapy:  Limited  Modes of Intervention:  Discussion, Socialization and Support  Summary of Progress/Problems: Today's group focused on topics such as hope and care. Cindy Bird stated "hope is a female." She discussed a person she knows named Hope, who is actually a family member and someone with whom [name] has been in conflict.  The story was convoluted and difficult to follow, but sounded like one of the factors that has contributed to her decompensation.  She was unable to define hope in a more abstract way. Cindy Bird chose a picture of bees because was unable to relate it to hope. It was difficult to understand what she was trying to say. She was disorganized and interrupted many times.   Hyatt,[name] 08/07/2014, 1:20 PM

## 2014-08-07 NOTE — Plan of Care (Signed)
Problem: Ineffective individual coping Goal: STG: Pt will be able to identify effective and ineffective STG: Pt will be able to identify effective and ineffective coping patterns  Outcome: Not Progressing Pt talks about everything but ways to cope when it's mentioned Goal: STG:Pt. will utilize relaxation techniques to reduce stress STG: Patient will utilize relaxation techniques to reduce stress levels  Outcome: Not Progressing Pt does not listen or try to do breathing techniques when pt is upset, pt just storms of yelling and screaming  Problem: Alteration in thought process Goal: LTG-Patient has not harmed self or others in at least 2 days Outcome: Progressing Pt safe on the unit at this time

## 2014-08-07 NOTE — Progress Notes (Signed)
D:  Per pt self inventory pt reports sleeping "Not worth a fuck", on self inventory she answered yes no and maybe to having SI and on paper she drew an arrow pointing to a drawing of a hand with the middle finger sticking up, pt has been very labile for the shift, needs frequent redirection, will all of the sudden storm down the hall yelling curse words, attempted to teach patient how to do relaxation techniques and deep breathing exercises however patient was only able to return demonstrate 2 deep breaths, pt is hyper-verbal and was not able to stop talking long enough to do the deep breathing exercises, intrusive, multiple somatic complaints--including a fever blister on her lip, splinter to one of her toes, dry mouth, constipation, and "hot spots" in her skin folds, pt has been demanding, dominating and monopolizing staff time, tangential.  A:  Gave patient laxative per orders and prune juice after dinner, Emotional support provided, Encouraged pt to continue with treatment plan and attend all group activities, q15 min checks maintained for safety.  R:  Pt is going to most groups but gets very upset when staff leading groups try to set limits with her so that other patients can participate and share, pt refused her evening insulin dose stating "fuck that fucking shit, I dont' want it."  Pt became upset at visitation when her husband came to visit and yelled "I just married this man and we ain't gonna do that!"--patient was implying that staff thought she and her husband were going to have sex during the visitation however no staff member said anything to the patient of that nature.

## 2014-08-08 DIAGNOSIS — F319 Bipolar disorder, unspecified: Secondary | ICD-10-CM

## 2014-08-08 DIAGNOSIS — F1290 Cannabis use, unspecified, uncomplicated: Secondary | ICD-10-CM

## 2014-08-08 LAB — GLUCOSE, CAPILLARY
GLUCOSE-CAPILLARY: 161 mg/dL — AB (ref 70–99)
GLUCOSE-CAPILLARY: 172 mg/dL — AB (ref 70–99)
Glucose-Capillary: 196 mg/dL — ABNORMAL HIGH (ref 70–99)
Glucose-Capillary: 198 mg/dL — ABNORMAL HIGH (ref 70–99)

## 2014-08-08 MED ORDER — LORAZEPAM 1 MG PO TABS
2.0000 mg | ORAL_TABLET | Freq: Once | ORAL | Status: AC
  Administered 2014-08-08: 2 mg via ORAL

## 2014-08-08 MED ORDER — LORAZEPAM 1 MG PO TABS
ORAL_TABLET | ORAL | Status: AC
  Filled 2014-08-08: qty 1

## 2014-08-08 MED ORDER — LORAZEPAM 1 MG PO TABS
1.0000 mg | ORAL_TABLET | Freq: Three times a day (TID) | ORAL | Status: DC | PRN
  Administered 2014-08-08 – 2014-08-09 (×2): 1 mg via ORAL
  Filled 2014-08-08: qty 1

## 2014-08-08 MED ORDER — MAGNESIUM CITRATE PO SOLN
1.0000 | Freq: Once | ORAL | Status: AC
  Administered 2014-08-08: 1 via ORAL

## 2014-08-08 MED ORDER — QUETIAPINE FUMARATE 50 MG PO TABS
50.0000 mg | ORAL_TABLET | Freq: Three times a day (TID) | ORAL | Status: DC | PRN
  Administered 2014-08-08 – 2014-08-09 (×3): 50 mg via ORAL
  Filled 2014-08-08 (×2): qty 1

## 2014-08-08 MED ORDER — QUETIAPINE FUMARATE 50 MG PO TABS
ORAL_TABLET | ORAL | Status: AC
  Administered 2014-08-08: 50 mg via ORAL
  Filled 2014-08-08: qty 1

## 2014-08-08 MED ORDER — LORAZEPAM 1 MG PO TABS
ORAL_TABLET | ORAL | Status: AC
  Administered 2014-08-08: 2 mg via ORAL
  Filled 2014-08-08: qty 2

## 2014-08-08 MED ORDER — QUETIAPINE FUMARATE ER 50 MG PO TB24
100.0000 mg | ORAL_TABLET | Freq: Every day | ORAL | Status: DC
  Administered 2014-08-08: 100 mg via ORAL
  Filled 2014-08-08 (×6): qty 2

## 2014-08-08 MED ORDER — BISACODYL 10 MG RE SUPP
10.0000 mg | Freq: Once | RECTAL | Status: AC
  Administered 2014-08-08: 10 mg via RECTAL
  Filled 2014-08-08 (×2): qty 1

## 2014-08-08 NOTE — Progress Notes (Signed)
Psychoeducational Group Note  Date:  08/08/2014 Time:  2120  Group Topic/Focus:  Wrap-Up Group:   The focus of this group is to help patients review their daily goal of treatment and discuss progress on daily workbooks.  Participation Level: Did Not Attend  Participation Quality:  Not Applicable  Affect:  Not Applicable  Cognitive:  Not Applicable  Insight:  Not Applicable  Engagement in Group: Not Applicable  Additional Comments:  The patient did not attend group this evening since she was asleep in her bedroom.   Hazle CocaGOODMAN, Christohper Dube S 08/08/2014, 9:20 PM

## 2014-08-08 NOTE — [facility] Group Notes (Signed)
[facility] Group Notes: coping skills and goals  Date:  08/08/2014  Time:  10:00am  Type of Therapy:  Nurse Education  Participation Level:  Active  Participation Quality:  Appropriate  Affect:  Appropriate  Cognitive:  Alert  Insight:  Appropriate  Engagement in Group:  Lacking  Modes of Intervention:  Discussion  Summary of Progress/Problems:  Nicole CellaWebb, Melbert Botelho Guyes 08/08/2014, 10:43 AM

## 2014-08-08 NOTE — Progress Notes (Addendum)
Pt can be very loud and intrusive. She has multiple somatic compliants including sore lips, toenail pain, Back pain and constipation. Pt appears very disorganized and has a flight of ideas. She can be  cooperative and contracts for safety. She denies SI and HI. Pt states she does not hear any voices. Pt constantly complains and rambles. Pt was on the phone with her husband and all of a sudden stated," you get the fuck out of my house and take all your belongings. Okay I love you Bye."Pt FSBS was 196 at 12noon- Pt was given 4 units  of novologue. Pt continues to be intrusive and loud. Her great toes are reddened and warm to the touch. NP made aware.3:45pm-pt began yelling and screaming. Security was contacted and Dr. Jama Flavorsobos came back to see the pt. Pt was given 1mg  of ativan and earlier was given 50mg  of visteral by nurse, [place]-4pm Pt was given a bottle mag citrate-Pt was covered with 4 units of novologue for a FSBS of 194.5:30pm-security was paged due to pts loud voice and threatening attitude. Charge nurse ,Thayer Ohmhris , with pt. Pt stated,"I will leave here one way or another.""There are problems and I do not have a problem dealing with the god damn police dept." 6:15pm-Pt has received 30cc of MOM, I bottle of mag citrate and plenty of water during the course of the day. NP made aware and pt was just given i dulcolax suppository. Pt also was given at 1757 2mg  of ativan and 50mg  of seroquel for her disruptive threatening behavior.  Pt during the day has been slamming her bedroom door and having sporadic outburst of anger. MD made aware

## 2014-08-08 NOTE — Progress Notes (Signed)
Patient ID: Cindy Bird, female   DOB: 1970/09/04, 44 y.o.   MRN: [id number] [facility] MD Progress Note  08/08/2014 12:20 PM Cindy Bird  MRN:  [id number] Subjective:  Patient reports she is doing " the same"  Objective: I have met with patient and have discussed case with Nursing Staff.  As discussed with staff patient has continued to be agitated and loud at times, but with a tendency towards being less significantly irritable.  Noted to be more irritable and upset after phone conversations with family.  At this time is relatively pleasant, cooperative, but remains quite tangential with open ended questions. She has some insight into this and states she realizes " it is hard for me to collect my thoughts".  Denies any SI or HI. Also denies any medication side effects at this time.   Principal Problem: Bipolar disorder Unspecified,  R/O Bipolar disorder ,single episode manic versus Cannabis induced Bipolar and related disorder Diagnosis:   DSM 5; Primary Psychiatric Diagnosis: R/O Bipolar disorder ,single episode manic versus Cannabis induced Bipolar and related disorder   Secondary Psychiatric Diagnosis: Cannabis use disorder ,severe    Non Psychiatric Diagnosis: Fracture Ulnar styloid -right forearm Patient Active Problem List   Diagnosis Date Noted  . Bipolar disorder [F31.9] 08/06/2014  . Fracture of ulnar styloid [S52.613A] 08/06/2014  . DM (diabetes mellitus) [E11.9] 08/06/2014  . Cannabis use disorder, severe, dependence [F12.20] 08/04/2014   Total Time spent with patient: 20 minutes         Past Medical History: History reviewed. No pertinent past medical history. History reviewed. No pertinent past surgical history. Family History:  Family History  Problem Relation Age of Onset  . Cerebral palsy Brother    Social History:  History  Alcohol Use No     History  Drug Use  . Yes  . Special: Marijuana    Comment: today    History   Social History  .  Marital Status: Married    Spouse Name: N/A  . Number of Children: N/A  . Years of Education: N/A   Social History Main Topics  . Smoking status: Never Smoker   . Smokeless tobacco: Not on file  . Alcohol Use: No  . Drug Use: Yes    Special: Marijuana     Comment: today  . Sexual Activity: Yes    Birth Control/ Protection: None   Other Topics Concern  . None   Social History Narrative   Additional History:    Sleep: improved   Appetite:  Fair    Musculoskeletal: Strength & Muscle Tone: within normal limits Gait & Station: normal Patient leans: N/A   Psychiatric Specialty Exam: Physical Exam  ROS  Blood pressure 128/98, pulse 112, temperature 98 F (36.7 C), temperature source Oral, resp. rate 16, height $RemoveBe'5\' 3"'NahCgpRSi$  (1.6 m), weight 232 lb (105.235 kg), last menstrual period 07/04/2014.Body mass index is 41.11 kg/(m^2).  General Appearance: Fairly Groomed  Engineer, water::  Good  Speech:  Pressured at times   Volume:  Increased periodically  Mood:  patient describes mood as "OK"    Affect:  Labile, may be becoming less irritable   Thought Process:  Disorganized, Loose and Tangential   Orientation:  Full (Time, Place, and Person)  Thought Content:  Rumination- denies hallucinations   Suicidal Thoughts:  No at this time denies any thoughts of hurting self and  contracts for safety on unit   Homicidal Thoughts:  No  Memory:  Immediate;   Fair Recent;  Fair Remote;   Fair  Judgement:  Impaired  Insight:  Shallow  Psychomotor Activity:  Increased and Restlessness  Concentration:  Fair  Recall:  [employer] of Knowledge:Fair  Language: Fair  Akathisia:  No  Handed:  Right  AIMS (if indicated):     Assets:  Communication Skills Social Support  ADL's:  Intact  Cognition: WNL  Sleep:  Number of Hours: 5.75     Current Medications: Current Facility-Administered Medications  Medication Dose Route Frequency Provider Last Rate Last Dose  . acetaminophen (TYLENOL)  tablet 650 mg  650 mg Oral Q6H PRN Laverle Hobby, PA-C   650 mg at 08/05/14 9417  . alum & mag hydroxide-simeth (MAALOX/MYLANTA) 200-200-20 MG/5ML suspension 30 mL  30 mL Oral Q4H PRN Laverle Hobby, PA-C      . divalproex (DEPAKOTE) DR tablet 500 mg  500 mg Oral Q12H Ursula Alert, MD   500 mg at 08/08/14 0808  . gabapentin (NEURONTIN) capsule 300 mg  300 mg Oral TID Ursula Alert, MD   300 mg at 08/08/14 1159  . hydrOXYzine (ATARAX/VISTARIL) tablet 50 mg  50 mg Oral TID PRN Janett Labella, NP   50 mg at 08/07/14 2141  . ibuprofen (ADVIL,MOTRIN) tablet 600 mg  600 mg Oral Q6H PRN Laverle Hobby, PA-C   600 mg at 08/07/14 2141  . insulin aspart (novoLOG) injection 0-20 Units  0-20 Units Subcutaneous TID WC Laverle Hobby, PA-C   4 Units at 08/08/14 1203  . insulin glargine (LANTUS) injection 6 Units  6 Units Subcutaneous QHS Laverle Hobby, PA-C   6 Units at 08/07/14 2139  . magnesium hydroxide (MILK OF MAGNESIA) suspension 30 mL  30 mL Oral Daily PRN Laverle Hobby, PA-C   30 mL at 08/07/14 1813  . metFORMIN (GLUCOPHAGE) tablet 500 mg  500 mg Oral BID WC Laverle Hobby, PA-C   500 mg at 08/08/14 4081  . nystatin (MYCOSTATIN/NYSTOP) topical powder   Topical BID Laverle Hobby, PA-C      . polyethylene glycol (MIRALAX / GLYCOLAX) packet 17 g  17 g Oral Daily Janett Labella, NP   17 g at 08/08/14 0809  . QUEtiapine (SEROQUEL XR) 24 hr tablet 50 mg  50 mg Oral QHS Ursula Alert, MD   50 mg at 08/07/14 2141    Lab Results:  Results for orders placed or performed during the hospital encounter of 08/03/14 (from the past 48 hour(s))  Glucose, capillary     Status: Abnormal   Collection Time: 08/06/14  5:05 PM  Result Value Ref Range   Glucose-Capillary 172 (H) 70 - 99 mg/dL  Glucose, capillary     Status: Abnormal   Collection Time: 08/06/14  8:41 PM  Result Value Ref Range   Glucose-Capillary 199 (H) 70 - 99 mg/dL   Comment 1 Notify RN   Glucose, capillary     Status: Abnormal    Collection Time: 08/07/14  6:03 AM  Result Value Ref Range   Glucose-Capillary 143 (H) 70 - 99 mg/dL  Valproic acid level     Status: None   Collection Time: 08/07/14  6:42 AM  Result Value Ref Range   Valproic Acid Lvl 71.7 50.0 - 100.0 ug/mL    Comment: Performed at [facility]  Glucose, capillary     Status: Abnormal   Collection Time: 08/07/14 11:25 AM  Result Value Ref Range   Glucose-Capillary 180 (H) 70 - 99 mg/dL  Glucose, capillary     Status: Abnormal   Collection Time: 08/07/14  5:05 PM  Result Value Ref Range   Glucose-Capillary 158 (H) 70 - 99 mg/dL   Comment 1 Document in Chart    Comment 2 Repeat Test   Glucose, capillary     Status: Abnormal   Collection Time: 08/07/14  8:19 PM  Result Value Ref Range   Glucose-Capillary 214 (H) 70 - 99 mg/dL   Comment 1 Notify RN   Glucose, capillary     Status: Abnormal   Collection Time: 08/08/14  6:10 AM  Result Value Ref Range   Glucose-Capillary 161 (H) 70 - 99 mg/dL   Comment 1 Notify RN   Glucose, capillary     Status: Abnormal   Collection Time: 08/08/14 11:57 AM  Result Value Ref Range   Glucose-Capillary 196 (H) 70 - 99 mg/dL    Physical Findings: AIMS: Facial and Oral Movements Muscles of Facial Expression: None, normal Lips and Perioral Area: None, normal Jaw: None, normal Tongue: None, normal,Extremity Movements Upper (arms, wrists, hands, fingers): None, normal Lower (legs, knees, ankles, toes): None, normal, Trunk Movements Neck, shoulders, hips: None, normal, Overall Severity Severity of abnormal movements (highest score from questions above): None, normal Incapacitation due to abnormal movements: None, normal Patient's awareness of abnormal movements (rate only patient's report): No Awareness, Dental Status Current problems with teeth and/or dentures?: Yes (Dental carries) Does patient usually wear dentures?: No  CIWA:  CIWA-Ar Total: 0 COWS:     Assessment: Some improvement compared to  prior presentation but still labile, irritable, with a tendency towards disorganized thought process. Denies SI, but antipsychotic dose has been been maintained low due to QTc prolongation .      Treatment Plan Summary: Daily contact with patient to assess and evaluate symptoms and progress in treatment and Medication management   Depakote ER 500 mgrs BID Neurontin 300 mgrs TID Seroquel 50 mgrs QHS Will repeat EKG . Pt to participate in therapeutic milieu.   Medical Decision Making:  Review of Psycho-Social Stressors (1), Review or order clinical lab tests (1), Established Problem, Worsening (2), Review of Last Therapy Session (1) and Review of Medication Regimen & Side Effects (2)     COBOS, FERNANDO MD 08/08/2014, 12:20 PM

## 2014-08-08 NOTE — Progress Notes (Signed)
Patient ID: Cindy Bird, female   DOB: 1970/10/22, 44 y.o.   MRN: [id number]  D: Patient drowsy and resting in her room with the lights out during shift. Pt did, however wake up to take her HS medications and was pleasant and cooperative with this RN on assessment.  A: Q 15 minute safety checks, encourage group participation, administer medications as ordered by MD. R: No s/s of distress noted; pt's respirations are regular and unlabored, skin warm and dry. Pt did not attend group session and continued to sleep.

## 2014-08-08 NOTE — [facility] Group Notes (Signed)
[facility] Group Notes:  (Clinical Social Work)  08/08/2014  11:15-12:00PM  Summary of Progress/Problems:   The main focus of today's process group was to discuss patients' feelings about hospitalization, the stigma attached to mental health, and sources of motivation to stay well.  We then worked to identify a specific plan to avoid future hospitalizations when discharged from the hospital for this admission.  The patient came in late to group, was highly manic and intrusive, and was disgruntled when CSW attempted numerous times to politely redirect her, eventually left.  Type of Therapy:  Group Therapy - Process  Participation Level:  Minimal  Participation Quality:  Intrusive and Monopolizing  Affect:  Excited and Labile  Cognitive:  Lacking  Insight:  Lacking  Engagement in Therapy:  Lacking  Modes of Intervention:  Exploration, Discussion  Ambrose MantleMareida Grossman-Orr, LCSW 08/08/2014, 5:08 PM

## 2014-08-09 ENCOUNTER — Other Ambulatory Visit: Payer: Self-pay

## 2014-08-09 LAB — GLUCOSE, CAPILLARY
GLUCOSE-CAPILLARY: 183 mg/dL — AB (ref 70–99)
GLUCOSE-CAPILLARY: 198 mg/dL — AB (ref 70–99)
GLUCOSE-CAPILLARY: 167 mg/dL — AB (ref 70–99)
Glucose-Capillary: 195 mg/dL — ABNORMAL HIGH (ref 70–99)

## 2014-08-09 MED ORDER — BIOTENE DRY MOUTH MT LIQD
15.0000 mL | OROMUCOSAL | Status: DC | PRN
  Administered 2014-08-09 – 2014-08-10 (×3): 15 mL via OROMUCOSAL
  Filled 2014-08-09: qty 473

## 2014-08-09 MED ORDER — DIVALPROEX SODIUM ER 250 MG PO TB24
500.0000 mg | ORAL_TABLET | Freq: Every morning | ORAL | Status: DC
  Administered 2014-08-10 – 2014-08-12 (×3): 500 mg via ORAL
  Filled 2014-08-09 (×4): qty 1
  Filled 2014-08-09: qty 70
  Filled 2014-08-09: qty 1

## 2014-08-09 MED ORDER — DIVALPROEX SODIUM ER 500 MG PO TB24
750.0000 mg | ORAL_TABLET | Freq: Every day | ORAL | Status: DC
  Administered 2014-08-09 – 2014-08-11 (×3): 750 mg via ORAL
  Filled 2014-08-09 (×6): qty 1

## 2014-08-09 MED ORDER — LORAZEPAM 1 MG PO TABS
2.0000 mg | ORAL_TABLET | Freq: Three times a day (TID) | ORAL | Status: DC | PRN
  Administered 2014-08-09 – 2014-08-12 (×4): 2 mg via ORAL
  Filled 2014-08-09 (×4): qty 2

## 2014-08-09 NOTE — Progress Notes (Signed)
  Patient awake and noted to having thrown up in the floor beside her bed. Pt also reports to have had a large bowel movement in the toilet. Staff attempting to clean up vomit with towels and disinfectant. Pt hyperverbal, needing frequent redirection due to cursing and volume. Pt angry and defiant with instructions. Pt then walking to the nursing station, talking loudly at the desk as another pt trying to sleep in quiet room. Staff opened dayroom to offer the pt an area to sit while her floor was being cleaned. Pt refusing to leave desk and sit in dayroom until others woke up. Pt states "Fuck you, you don't tell me what to do. I ain't taking any more pills while I'm here. I don't need them!". Pt eventually went to her room, while continually cursing down the hall and into her room yelling "Fuck Ya'll!". Pt's behaviors unable to be redirected and remains agitated.

## 2014-08-09 NOTE — [facility] Group Notes (Signed)
[facility] Group Notes:  (Nursing/MHT/Case Management/Adjunct)  Date:  08/09/2014  Time: 0930 am  Type of Therapy:  Psychoeducational Skills  Participation Level:  Did Not Attend   Cranford MonBeaudry, Percival Glasheen Evans 08/09/2014, 10:34 AM

## 2014-08-09 NOTE — Progress Notes (Signed)
Nursing staff presented current 12-lead EKG to this NP. Compared to EKG from 02/11. Apparently 02/12 ordered EKG was not performed (unsure of reason why). Noted significant QT and QTc prolongation comparing the two images. Advised nursing staff to hold off on PM Seroquel and PRN Seroquel dosages until a repeat EKG can be performed in AM. Increased Ativan from 1mg  to 2mg  PO q8h PRN agitation due to pt's aggressive history and lack of PRN medication with Seroquel on hold.  Beau FannyWithrow, John C, FNP-BC 08/09/14     06:38PM

## 2014-08-09 NOTE — Progress Notes (Signed)
Patient remains labile, loud, cursing, agitated. Observed in dayroom alone cursing loudly. Turning over items provided to patients in the dayroom - creamer, sugar, etc.  Speech remains loud, pressured. Thinking is disorganized and tangential. Offered meds but patient refusing. Patient states, "there's nothing wrong with my mood. I don't want that depakote." When asked about whether she needs her miralax today she tells a long, tangential story without answering the question. Difficult to redirect. Denying SI/HI/AVH. No pain or physical problems reported. Will continue to monitor closely as she is disruptive to her peers. Lawrence MarseillesFriedman, Yochanan Eddleman Eakes

## 2014-08-09 NOTE — Progress Notes (Signed)
  Pt angry and cursing at her significant other on the phone saying "Fuck you and fuck this place! You better come pick me up today, bring the kids so I have an excuse to get out". Pt loud and disruptive; pt kept back from going to cafeteria due to behaviors.

## 2014-08-09 NOTE — Progress Notes (Signed)
Patient has had a much calmer afternoon. Medicated with ativan and seroquel prn. Patient napped and awoke refreshed. Much more cooperative and compliant. Complaining of a headache of 5/10. Medicated with motrin. Will reassess in one hour. Lawrence MarseillesFriedman, Remona Boom Eakes

## 2014-08-09 NOTE — Plan of Care (Signed)
Problem: Alteration in thought process Goal: STG-Patient is able to follow short directions Outcome: Not Progressing Patient unable to follow short, reasonable directions. Difficult to redirect.  Problem: Alteration in mood & ability to function due to Goal: STG-Patient will comply with prescribed medication regimen (Patient will comply with prescribed medication regimen)  Outcome: Not Progressing Patient refusing meds.

## 2014-08-09 NOTE — Progress Notes (Signed)
Patient ID: Cindy Bird, female   DOB: 09/19/1970, 44 y.o.   MRN: [id number] [facility] MD Progress Note  08/09/2014 1:27 PM Cindy Bird  MRN:  [id number] Subjective:  Patient states she is "OK".   Objective: I have met with patient and have discussed case with Nursing Staff. I met with patient along with RN. As discussed with staff, patient has continued to present with emotional lability, periods of agitation, when she becomes loud, uses fowl language. Recently threw some items in her room on the floor.  No violence towards peers or staff. At this time presents calm, pleasant, and not agitated. She has limited insight regarding her behavioral issues.  She does seem to be becoming more easily redirectable.  Staff has noted that these agitated episodes are more common after she speaks with family members on the phone. Denies medication side effects.  Most recent valproic acid serum level 71.7     Principal Problem: Bipolar disorder Unspecified,  R/O Bipolar disorder ,single episode manic versus Cannabis induced Bipolar and related disorder Diagnosis:   DSM 5; Primary Psychiatric Diagnosis: R/O Bipolar disorder ,single episode manic versus Cannabis induced Bipolar and related disorder   Secondary Psychiatric Diagnosis: Cannabis use disorder ,severe    Non Psychiatric Diagnosis: Fracture Ulnar styloid -right forearm Patient Active Problem List   Diagnosis Date Noted  . Bipolar disorder [F31.9] 08/06/2014  . Fracture of ulnar styloid [S52.613A] 08/06/2014  . DM (diabetes mellitus) [E11.9] 08/06/2014  . Cannabis use disorder, severe, dependence [F12.20] 08/04/2014   Total Time spent with patient: 20 minutes         Past Medical History: History reviewed. No pertinent past medical history. History reviewed. No pertinent past surgical history. Family History:  Family History  Problem Relation Age of Onset  . Cerebral palsy Brother    Social History:  History  Alcohol  Use No     History  Drug Use  . Yes  . Special: Marijuana    Comment: today    History   Social History  . Marital Status: Married    Spouse Name: N/A  . Number of Children: N/A  . Years of Education: N/A   Social History Main Topics  . Smoking status: Never Smoker   . Smokeless tobacco: Not on file  . Alcohol Use: No  . Drug Use: Yes    Special: Marijuana     Comment: today  . Sexual Activity: Yes    Birth Control/ Protection: None   Other Topics Concern  . None   Social History Narrative   Additional History:    Sleep: improved   Appetite:  Fair    Musculoskeletal: Strength & Muscle Tone: within normal limits Gait & Station: normal Patient leans: N/A   Psychiatric Specialty Exam: Physical Exam  ROS- not endorsing chest pain, shortness of breath, cough . Denies abdominal pain.   Blood pressure 128/98, pulse 112, temperature 98 F (36.7 C), temperature source Oral, resp. rate 16, height $RemoveBe'5\' 3"'JxiwivNsk$  (1.6 m), weight 232 lb (105.235 kg), last menstrual period 07/04/2014.Body mass index is 41.11 kg/(m^2).  General Appearance: Fairly Groomed  Engineer, water::  Good  Speech:  Variable but at this time normal, not loud   Volume:  Increased periodically  Mood:  Denies depression.  Affect:  Labile, at this present moment calm, pleasant, but has episodes of anger , lability  Thought Process:  Remains disorganized, tangential, difficult to follow at times   Orientation:  Full (Time, Place, and Person)  Thought  Content:   Denies hallucinations, no delusions  Suicidal Thoughts:  No at this time denies any thoughts of hurting self and  contracts for safety on unit   Homicidal Thoughts:  No  Memory:  Immediate;   Fair Recent;   Fair Remote;   Fair  Judgement:  Impaired  Insight:  Shallow  Psychomotor Activity:  Variable, presents with periods of agitaiton  Concentration:  Fair  Recall:  [employer] of Knowledge:Fair  Language: Fair  Akathisia:  No  Handed:  Right  AIMS  (if indicated):     Assets:  [employer] logistics/support/administrative officer Social Support  ADL's:  Intact  Cognition: WNL  Sleep:  Number of Hours: 5.75     Current Medications: Current Facility-Administered Medications  Medication Dose Route Frequency Provider Last Rate Last Dose  . acetaminophen (TYLENOL) tablet 650 mg  650 mg Oral Q6H PRN Laverle Hobby, PA-C   650 mg at 08/05/14 5852  . alum & mag hydroxide-simeth (MAALOX/MYLANTA) 200-200-20 MG/5ML suspension 30 mL  30 mL Oral Q4H PRN Laverle Hobby, PA-C      . antiseptic oral rinse (BIOTENE) solution 15 mL  15 mL Mouth Rinse PRN Janett Labella, NP   15 mL at 08/09/14 1148  . divalproex (DEPAKOTE) DR tablet 500 mg  500 mg Oral Q12H Saramma Eappen, MD   500 mg at 08/09/14 0940  . gabapentin (NEURONTIN) capsule 300 mg  300 mg Oral TID Ursula Alert, MD   300 mg at 08/09/14 1149  . hydrOXYzine (ATARAX/VISTARIL) tablet 50 mg  50 mg Oral TID PRN Janett Labella, NP   50 mg at 08/08/14 1454  . ibuprofen (ADVIL,MOTRIN) tablet 600 mg  600 mg Oral Q6H PRN Laverle Hobby, PA-C   600 mg at 08/07/14 2141  . insulin aspart (novoLOG) injection 0-20 Units  0-20 Units Subcutaneous TID WC Laverle Hobby, PA-C   4 Units at 08/09/14 1158  . insulin glargine (LANTUS) injection 6 Units  6 Units Subcutaneous QHS Laverle Hobby, PA-C   6 Units at 08/08/14 2115  . LORazepam (ATIVAN) tablet 1 mg  1 mg Oral Q8H PRN Jenne Campus, MD   1 mg at 08/09/14 0940  . magnesium hydroxide (MILK OF MAGNESIA) suspension 30 mL  30 mL Oral Daily PRN Laverle Hobby, PA-C   30 mL at 08/08/14 0900  . metFORMIN (GLUCOPHAGE) tablet 500 mg  500 mg Oral BID WC Laverle Hobby, PA-C   500 mg at 08/08/14 1731  . nystatin (MYCOSTATIN/NYSTOP) topical powder   Topical BID Laverle Hobby, PA-C      . polyethylene glycol (MIRALAX / GLYCOLAX) packet 17 g  17 g Oral Daily Janett Labella, NP   17 g at 08/08/14 0809  . QUEtiapine (SEROQUEL XR) 24 hr tablet 100 mg  100 mg Oral QHS Encarnacion Slates,  NP   100 mg at 08/08/14 2115  . QUEtiapine (SEROQUEL) tablet 50 mg  50 mg Oral TID PRN Encarnacion Slates, NP   50 mg at 08/09/14 1149    Lab Results:  Results for orders placed or performed during the hospital encounter of 08/03/14 (from the past 48 hour(s))  Glucose, capillary     Status: Abnormal   Collection Time: 08/07/14  5:05 PM  Result Value Ref Range   Glucose-Capillary 158 (H) 70 - 99 mg/dL   Comment 1 Document in Chart    Comment 2 Repeat Test   Glucose, capillary  Status: Abnormal   Collection Time: 08/07/14  8:19 PM  Result Value Ref Range   Glucose-Capillary 214 (H) 70 - 99 mg/dL   Comment 1 Notify RN   Glucose, capillary     Status: Abnormal   Collection Time: 08/08/14  6:10 AM  Result Value Ref Range   Glucose-Capillary 161 (H) 70 - 99 mg/dL   Comment 1 Notify RN   Glucose, capillary     Status: Abnormal   Collection Time: 08/08/14 11:57 AM  Result Value Ref Range   Glucose-Capillary 196 (H) 70 - 99 mg/dL  Glucose, capillary     Status: Abnormal   Collection Time: 08/08/14  4:52 PM  Result Value Ref Range   Glucose-Capillary 198 (H) 70 - 99 mg/dL  Glucose, capillary     Status: Abnormal   Collection Time: 08/08/14  8:33 PM  Result Value Ref Range   Glucose-Capillary 172 (H) 70 - 99 mg/dL  Glucose, capillary     Status: Abnormal   Collection Time: 08/09/14  5:26 AM  Result Value Ref Range   Glucose-Capillary 183 (H) 70 - 99 mg/dL  Glucose, capillary     Status: Abnormal   Collection Time: 08/09/14 11:56 AM  Result Value Ref Range   Glucose-Capillary 198 (H) 70 - 99 mg/dL   Comment 1 Notify RN     Physical Findings: AIMS: Facial and Oral Movements Muscles of Facial Expression: None, normal Lips and Perioral Area: None, normal Jaw: None, normal Tongue: None, normal,Extremity Movements Upper (arms, wrists, hands, fingers): None, normal Lower (legs, knees, ankles, toes): None, normal, Trunk Movements Neck, shoulders, hips: None, normal, Overall  Severity Severity of abnormal movements (highest score from questions above): None, normal Incapacitation due to abnormal movements: None, normal Patient's awareness of abnormal movements (rate only patient's report): No Awareness, Dental Status Current problems with teeth and/or dentures?: Yes Does patient usually wear dentures?: No  CIWA:  CIWA-Ar Total: 0 COWS:     Assessment: Remains labile and loud, agitated at times, but overall has had longer periods of being calm, and is becoming more redirectable by staff. Still quite labile, however,  and thought process remains tangential,Denies SI. Tolerating medications well.      Treatment Plan Summary: Daily contact with patient to assess and evaluate symptoms and progress in treatment and Medication management   Increase Depakote ER to 500 mgrs QAM and 750 mgrs QHS  Neurontin 300 mgrs TID Seroquel 50 mgrs QHS Pt to participate in therapeutic milieu.   Medical Decision Making:  Review of Psycho-Social Stressors (1), Review or order clinical lab tests (1), Established Problem, Worsening (2), Review of Last Therapy Session (1), Review of Medication Regimen & Side Effects (2) and Review of New Medication or Change in Dosage (2)     COBOS, FERNANDO MD 08/09/2014, 1:27 PM

## 2014-08-09 NOTE — [facility] Group Notes (Signed)
[facility] Group Notes:  (Clinical Social Work)  08/09/2014   11:15am-12:00pm  Summary of Progress/Problems:  The main focus of today's process group was to listen to a variety of genres of music and to identify that different types of music provoke different responses.  The patient then was able to identify personally what was soothing for them, as well as energizing.  Handouts were used to record feelings evoked, as well as how patient can personally use this knowledge in sleep habits, with depression, and with other symptoms.  The patient expressed understanding of concepts, as well as knowledge of how each type of music affected him/her and how this can be used at home as a wellness/recovery tool.  She was initially extremely enraged when CSW asked her to be quiet and listen to the music, had an outburst then left room.  She was in and out of the room throughout group.  At the end of group, she came in completely calm and started asking for specific music selections.  Type of Therapy:  Music Therapy   Participation Level:  Active  Participation Quality:  Attentive and Inattentive alternating  Affect:  Flat and Irritable  Cognitive:  Lacking  Insight: Improving  Engagement in Therapy:  Improving  Modes of Intervention:   Activity, Exploration  Ambrose MantleMareida Grossman-Orr, LCSW 08/09/2014, 12:30pm

## 2014-08-09 NOTE — Progress Notes (Signed)
Patient did come back up to nurses' station where she has continued to speak continuously. Did agree to take neurontin, seroquel, metformin but still would not take depakote. Patient calmer as this Clinical research associatewriter listened to patient for 15-20 minutes however patient then tore up room, ripping off mattress and turning 2 end tables over. Throwing all belongings in room and out into hallway. Will not verbalize what's wrong. "Why the fuck should I tell you?" After a few minutes, patient agreed to clean up room. Lawrence MarseillesFriedman, Einar Nolasco Eakes

## 2014-08-09 NOTE — Progress Notes (Signed)
  D: Patient loud and disruptive this evening, cursing without provocation. Pt asked by RN to try to refrain from loud cursing, pt apologetic to staff and eventually laughing and joking later in the evening. PRN Ativan given at HS; Seroquel dose held as ordered. No s/s of distress noted this shift.  A: Q 15 minute safety checks, encourage staff/peer interaction and group participation, administer medications as ordered by MD. R: Pt compliant with medications and group session. Pt denies SI or plans to harm herself at this time.

## 2014-08-10 LAB — GLUCOSE, CAPILLARY
GLUCOSE-CAPILLARY: 118 mg/dL — AB (ref 70–99)
Glucose-Capillary: 144 mg/dL — ABNORMAL HIGH (ref 70–99)
Glucose-Capillary: 141 mg/dL — ABNORMAL HIGH (ref 70–99)
Glucose-Capillary: 178 mg/dL — ABNORMAL HIGH (ref 70–99)

## 2014-08-10 LAB — VALPROIC ACID LEVEL: VALPROIC ACID LVL: 63 ug/mL (ref 50.0–100.0)

## 2014-08-10 MED ORDER — GABAPENTIN 400 MG PO CAPS
400.0000 mg | ORAL_CAPSULE | Freq: Three times a day (TID) | ORAL | Status: DC
  Administered 2014-08-10 – 2014-08-12 (×5): 400 mg via ORAL
  Filled 2014-08-10 (×4): qty 1
  Filled 2014-08-10: qty 42
  Filled 2014-08-10 (×4): qty 1
  Filled 2014-08-10 (×2): qty 42

## 2014-08-10 NOTE — Progress Notes (Signed)
D: Patient denies SI/HI and A/V hallucinations; patient reported that she slept well and was still sleepy but did not want to lay down; patient reported that she was upset because the MHT's kept walking in during her visitation time; patient reports that she is upset about what happened with the counselor in the morning group  A: Monitored q 15 minutes; patient encouraged to attend groups; patient educated about medications; patient given medications per physician orders; patient encouraged to express feelings and/or concerns  R: Patient is redirectable at this time; patient can get agitated and speak very loudly and almost yelling; patient is constantly scratching and picking her arms to the point its bleeding; patient can follow simple commands; patient conversation is disorganized but can calm down; patient's interaction with staff and peers is assertive; patient was able to set goal to talk with staff 1:1 when having feelings of SI; patient is taking medications as prescribed and tolerating medications

## 2014-08-10 NOTE — Progress Notes (Signed)
Adult Psychoeducational Group Note  Date:  08/10/2014 Time:  1:07 AM  Group Topic/Focus:  Wrap-Up Group:   The focus of this group is to help patients review their daily goal of treatment and discuss progress on daily workbooks.  Participation Level:  Minimal  Participation Quality:  Intrusive  Affect:  Excited  Cognitive:  Confused  Insight: Lacking  Engagement in Group:  Limited  Modes of Intervention:  Discussion  Additional Comments: The patient expressed that she attended the music therapy.The patient also seemed extremely confused and disorganized in her thought process.  Octavio Mannshigpen, Malike Foglio Lee 08/10/2014, 1:07 AM

## 2014-08-10 NOTE — Progress Notes (Signed)
Patient ID: Cindy Bird, female   DOB: [birth year], 44 y.o.   MRN: [id number] [facility] MD Progress Note  08/10/2014 2:01 PM Cindy Bird  MRN:  [id number] Subjective:  Patient states she is "OK".   Objective: I have met with patient and have discussed case with Nursing Staff. As discussed with staff, patient has continued to present with emotional lability, periods of agitation and outbursts . Pt however has improved since admission , less loose speech than admission. Patient usually gets agitated after talking to her family on the phone. No violence towards peers or staff. She has limited insight regarding her behavioral issues.    Denies medication side effects.Will DC seroquel due to EKG changes and QTC getting prolonged .     Principal Problem: Bipolar disorder Unspecified,  R/O Bipolar disorder ,single episode manic versus Cannabis induced Bipolar and related disorder Diagnosis:   DSM 5; Primary Psychiatric Diagnosis: R/O Bipolar disorder ,single episode manic versus Cannabis induced Bipolar and related disorder   Secondary Psychiatric Diagnosis: Cannabis use disorder ,severe    Non Psychiatric Diagnosis: Fracture Ulnar styloid -right forearm Patient Active Problem List   Diagnosis Date Noted  . Bipolar disorder [F31.9] 08/06/2014  . Fracture of ulnar styloid [S52.613A] 08/06/2014  . DM (diabetes mellitus) [E11.9] 08/06/2014  . Cannabis use disorder, severe, dependence [F12.20] 08/04/2014   Total Time spent with patient: 20 minutes         Past Medical History: History reviewed. No pertinent past medical history. History reviewed. No pertinent past surgical history. Family History:  Family History  Problem Relation Age of Onset  . Cerebral palsy Brother    Social History:  History  Alcohol Use No     History  Drug Use  . Yes  . Special: Marijuana    Comment: today    History   Social History  . Marital Status: Married    Spouse Name: N/A  . Number  of Children: N/A  . Years of Education: N/A   Social History Main Topics  . Smoking status: Never Smoker   . Smokeless tobacco: Not on file  . Alcohol Use: No  . Drug Use: Yes    Special: Marijuana     Comment: today  . Sexual Activity: Yes    Birth Control/ Protection: None   Other Topics Concern  . None   Social History Narrative   Additional History:    Sleep: improved   Appetite:  Fair    Musculoskeletal: Strength & Muscle Tone: within normal limits Gait & Station: normal Patient leans: N/A   Psychiatric Specialty Exam: Physical Exam  Review of Systems  Psychiatric/Behavioral: The patient is nervous/anxious.     Blood pressure 141/98, pulse 105, temperature 98.2 F (36.8 C), temperature source Oral, resp. rate 18, height _0  (1.6 m), weight 105.235 kg (232 lb), last menstrual period 07/04/2014.Body mass index is 41.11 kg/(m^2).  General Appearance: Fairly Groomed  Engineer, water::  Good  Speech:  Variable but at this time normal, not loud   Volume:  Increased periodically  Mood:  Denies depression.  Affect:  Labile, at this present moment calm, pleasant, but has episodes of anger , lability  Thought Process: more organized and less tangential   Orientation:  Full (Time, Place, and Person)  Thought Content:   Denies hallucinations, no delusions  Suicidal Thoughts:  No at this time denies any thoughts of hurting self and  contracts for safety on unit   Homicidal Thoughts:  No  Memory:  Immediate;   Fair Recent;   Fair Remote;   Fair  Judgement:  Impaired  Insight:  Shallow  Psychomotor Activity:  Variable, presents with periods of outbursts  Concentration:  Fair  Recall:  Triumph: Fair  Akathisia:  No  Handed:  Right  AIMS (if indicated):     Assets:  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 acetaminophen (TYLENOL) tablet 650 mg  650 mg Oral Q6H PRN Laverle Hobby, PA-C   650 mg at 08/05/14 4742  . alum & mag hydroxide-simeth (MAALOX/MYLANTA) 200-200-20 MG/5ML suspension 30 mL  30 mL Oral Q4H PRN Laverle Hobby, PA-C      . antiseptic oral rinse (BIOTENE) solution 15 mL  15 mL Mouth Rinse PRN Janett Labella, NP   15 mL at 08/09/14 1821  . divalproex (DEPAKOTE ER) 24 hr tablet 500 mg  500 mg Oral q morning - 10a Myer Peer Cobos, MD   500 mg at 08/10/14 1004  . divalproex (DEPAKOTE ER) 24 hr tablet 750 mg  750 mg Oral QHS Jenne Campus, MD   750 mg at 08/09/14 2102  . gabapentin (NEURONTIN) capsule 400 mg  400 mg Oral TID Ursula Alert, MD      . hydrOXYzine (ATARAX/VISTARIL) tablet 50 mg  50 mg Oral TID PRN Janett Labella, NP   50 mg at 08/08/14 1454  . ibuprofen (ADVIL,MOTRIN) tablet 600 mg  600 mg Oral Q6H PRN Laverle Hobby, PA-C   600 mg at 08/09/14 1739  . insulin aspart (novoLOG) injection 0-20 Units  0-20 Units Subcutaneous TID WC Laverle Hobby, PA-C   3 Units at 08/10/14 1203  . insulin glargine (LANTUS) injection 6 Units  6 Units Subcutaneous QHS Laverle Hobby, PA-C   6 Units at 08/09/14 2105  . LORazepam (ATIVAN) tablet 2 mg  2 mg Oral Q8H PRN Benjamine Mola, FNP   2 mg at 08/09/14 2103  . magnesium hydroxide (MILK OF MAGNESIA) suspension 30 mL  30 mL Oral Daily PRN Laverle Hobby, PA-C   30 mL at 08/08/14 0900  . metFORMIN (GLUCOPHAGE) tablet 500 mg  500 mg Oral BID WC Laverle Hobby, PA-C   500 mg at 08/10/14 0747  . nystatin (MYCOSTATIN/NYSTOP) topical powder   Topical BID Laverle Hobby, PA-C      . polyethylene glycol (MIRALAX / GLYCOLAX) packet 17 g  17 g Oral Daily Sheila May Agustin, NP   17 g at 08/10/14 5956    Lab Results:  Results for orders placed or performed during the hospital encounter of 08/03/14 (from the past 48 hour(s))  Glucose, capillary     Status: Abnormal    Collection Time: 08/08/14  4:52 PM  Result Value Ref Range   Glucose-Capillary 198 (H) 70 - 99 mg/dL  Glucose, capillary     Status: Abnormal   Collection Time: 08/08/14  8:33 PM  Result Value Ref Range   Glucose-Capillary 172 (H) 70 - 99 mg/dL  Glucose, capillary     Status: Abnormal   Collection Time: 08/09/14  5:26 AM  Result Value Ref Range   Glucose-Capillary 183 (H) 70 - 99 mg/dL  Glucose, capillary     Status: Abnormal   Collection Time: 08/09/14 11:56 AM  Result Value Ref Range   Glucose-Capillary 198 (H) 70 - 99 mg/dL   Comment 1 Notify RN   Glucose, capillary     Status: Abnormal   Collection Time: 08/09/14  5:04 PM  Result Value Ref Range   Glucose-Capillary 167 (H) 70 - 99 mg/dL  Glucose, capillary     Status: Abnormal   Collection Time: 08/09/14  8:45 PM  Result Value Ref Range   Glucose-Capillary 195 (H) 70 - 99 mg/dL  Valproic acid level     Status: None   Collection Time: 08/10/14  6:20 AM  Result Value Ref Range   Valproic Acid Lvl 63.0 50.0 - 100.0 ug/mL    Comment: Performed at [facility]  Glucose, capillary     Status: Abnormal   Collection Time: 08/10/14  7:56 AM  Result Value Ref Range   Glucose-Capillary 144 (H) 70 - 99 mg/dL  Glucose, capillary     Status: Abnormal   Collection Time: 08/10/14 11:46 AM  Result Value Ref Range   Glucose-Capillary 141 (H) 70 - 99 mg/dL    Physical Findings: AIMS: Facial and Oral Movements Muscles of Facial Expression: None, normal Lips and Perioral Area: None, normal Jaw: None, normal Tongue: None, normal,Extremity Movements Upper (arms, wrists, hands, fingers): None, normal Lower (legs, knees, ankles, toes): None, normal, Trunk Movements Neck, shoulders, hips: None, normal, Overall Severity Severity of abnormal movements (highest score from questions above): None, normal Incapacitation due to abnormal movements: None, normal Patient's awareness of abnormal movements (rate only patient's report): No  Awareness, Dental Status Current problems with teeth and/or dentures?: Yes Does patient usually wear dentures?: No  CIWA:  CIWA-Ar Total: 0 COWS:     Assessment: Remains labile and loud, agitated at times, but overall has had longer periods of being calm, and is becoming more redirectable by staff. Patient with improvement since admission , is not as tangential as admission. Pt with more organized thought process.    Treatment Plan Summary: Daily contact with patient to assess and evaluate symptoms and progress in treatment and Medication management   Continue Depakote ER  500 mgs QAM and 750 mgs QHS  Increase Neurontin to 400 mgrs TID Will DC seroquel due to prolonged QTC. Serial EKGs done since admission , her QTC seems to be getting prolonged with the use of medications like antipsychotics. Pt to participate in therapeutic milieu.   Medical Decision Making:  Established Problem, Stable/Improving (1), Review of Psycho-Social Stressors (1), Review or order clinical lab tests (1), Review of Last Therapy Session (1), Review of Medication Regimen & Side Effects (2) and Review of New Medication or Change in Dosage (2)     Phillip Sandler MD 08/10/2014, 2:01 PM

## 2014-08-10 NOTE — [facility] Group Notes (Signed)
[facility] LCSW Group Therapy  08/10/2014 1:15 pm  Type of Therapy: Process Group Therapy  Participation Level:  I suggested she not come to group as several patients had stated they would not attend group if she was there due to her intrusive manner.  "I'll have to check with my nurse about that."  She did not try to enter.  Summary of Progress/Problems: Today's group addressed the issue of overcoming obstacles.  Patients were asked to identify their biggest obstacle post d/c that stands in the way of their on-going success, and then problem solve as to how to manage this.  Cindy Bird, Cindy Bird B 08/10/2014   4:32 PM

## 2014-08-10 NOTE — Progress Notes (Signed)
Patient on the hallway during this assessment. She appeared dis organized but redirectable. She has been scratching her body and showed writer some marks and bruises on her arms. She denied SI/HI and denied hallucinations. Writer encouraged and supported patient. Printed another arm band for patient since she removed the old one and attached it to one of her tops. Writer to administer PRN Vistaril with patient's HS medication to alleviate the itching and help patient rest.Q 15 minute check continues to maintain safety.

## 2014-08-10 NOTE — [facility] Group Notes (Signed)
[facility]   08/10/2014 4:30 PM  Participation Quality:  None  Mood/Affect:  Labile  Depression Rating:    Anxiety Rating:    Thoughts of Suicide:  unknown Will you contract for safety?   unknown  Current AVH:  unknown  Plan for Discharge/Comments:  [place] came in and out several times, and when in, would sit for awhile and then wander around.  Did not interrupt others, so was tuned out by others for the most part.  I saved checking in with her for last, and she left for good before I got to her.  Transportation Means:   Supports:  Daryel GeraldNorth, Bethanie Bloxom B

## 2014-08-11 DIAGNOSIS — R9431 Abnormal electrocardiogram [ECG] [EKG]: Secondary | ICD-10-CM

## 2014-08-11 LAB — GLUCOSE, CAPILLARY
GLUCOSE-CAPILLARY: 119 mg/dL — AB (ref 70–99)
Glucose-Capillary: 118 mg/dL — ABNORMAL HIGH (ref 70–99)
Glucose-Capillary: 127 mg/dL — ABNORMAL HIGH (ref 70–99)
Glucose-Capillary: 153 mg/dL — ABNORMAL HIGH (ref 70–99)

## 2014-08-11 MED ORDER — HYDROXYZINE HCL 25 MG PO TABS
25.0000 mg | ORAL_TABLET | Freq: Three times a day (TID) | ORAL | Status: DC | PRN
  Administered 2014-08-11: 25 mg via ORAL
  Filled 2014-08-11: qty 1
  Filled 2014-08-11: qty 20

## 2014-08-11 MED ORDER — HYDROCORTISONE 1 % EX OINT
TOPICAL_OINTMENT | Freq: Two times a day (BID) | CUTANEOUS | Status: DC
  Administered 2014-08-12: 08:00:00 via TOPICAL
  Filled 2014-08-11: qty 28.35

## 2014-08-11 NOTE — [facility] Group Notes (Signed)
[facility] Group Notes:  (Counselor/Nursing/MHT/Case Management/Adjunct)  08/11/2014 1:15PM  Type of Therapy:  Group Therapy  Participation Level:  Active  Participation Quality:  Appropriate  Affect:  Flat  Cognitive:  Oriented  Insight:  Improving  Engagement in Group:  Limited  Engagement in Therapy:  Limited  Modes of Intervention:  Discussion, Exploration and Socialization  Summary of Progress/Problems: The topic for group was balance in life.  Pt participated in the discussion about when their life was in balance and out of balance and how this feels.  Pt discussed ways to get back in balance and short term goals they can work on to get where they want to be.  Wandered into group about half way through.  Immediately engaged.  Was initially appropriate and focused on topic.  Later, in response to another patient, began a long-winded discourse on the Bible that made no sense.  She eventually caught herself and stated "I don't know what my point is."  Continued talking.  I cut her off, and she decided to leave.   Ida Rogueorth, Les Longmore B 08/11/2014 12:33 PM

## 2014-08-11 NOTE — Progress Notes (Signed)
Patient ID: Cindy Bird, female   DOB: 03/29/1971, 44 y.o.   MRN: [id number] [facility] MD Progress Note  08/11/2014 12:30 PM Cindy Bird  MRN:  [id number] Subjective:  Patient states she is "OK".   Objective: I have met with patient and have discussed case with Nursing Staff. As discussed with staff, patient does have emotional lability as well as periodic outbursts , but is more redirectable. Pt is more organized with her thought process than admission. Pt does go off topic on and off , but is able to answer questions more appropriately and is able to better focus than before. No violence towards peers or staff. She has limited insight regarding her behavioral issues and intrusive behavior.   Denies medication side effects.     Principal Problem: Bipolar disorder Unspecified,  R/O Bipolar disorder ,single episode manic versus Cannabis induced Bipolar and related disorder Diagnosis:   DSM 5; Primary Psychiatric Diagnosis: R/O Bipolar disorder ,single episode manic versus Cannabis induced Bipolar and related disorder   Secondary Psychiatric Diagnosis: Cannabis use disorder ,severe    Non Psychiatric Diagnosis: Fracture Ulnar styloid -right forearm Patient Active Problem List   Diagnosis Date Noted  . Bipolar disorder [F31.9] 08/06/2014  . Fracture of ulnar styloid [S52.613A] 08/06/2014  . DM (diabetes mellitus) [E11.9] 08/06/2014  . Cannabis use disorder, severe, dependence [F12.20] 08/04/2014   Total Time spent with patient: 20 minutes         Past Medical History: History reviewed. No pertinent past medical history. History reviewed. No pertinent past surgical history. Family History:  Family History  Problem Relation Age of Onset  . Cerebral palsy Brother    Social History:  History  Alcohol Use No     History  Drug Use  . Yes  . Special: Marijuana    Comment: today    History   Social History  . Marital Status: Married    Spouse Name: N/A  . Number  of Children: N/A  . Years of Education: N/A   Social History Main Topics  . Smoking status: Never Smoker   . Smokeless tobacco: Not on file  . Alcohol Use: No  . Drug Use: Yes    Special: Marijuana     Comment: today  . Sexual Activity: Yes    Birth Control/ Protection: None   Other Topics Concern  . None   Social History Narrative   Additional History:    Sleep: improved   Appetite:  Fair    Musculoskeletal: Strength & Muscle Tone: within normal limits Gait & Station: normal Patient leans: N/A   Psychiatric Specialty Exam: Physical Exam  Review of Systems  Psychiatric/Behavioral: Positive for substance abuse. Negative for depression and suicidal ideas. The patient does not have insomnia.     Blood pressure 118/72, pulse 92, temperature 98.6 F (37 C), temperature source Oral, resp. rate 18, height 5' 3" (1.6 m), weight 105.235 kg (232 lb), last menstrual period 07/04/2014.Body mass index is 41.11 kg/(m^2).  General Appearance: Fairly Groomed  Engineer, water::  Good  Speech: normal rate   Volume:  Increased periodically  Mood:  Denies depression.  Affect:  Labile, can have outbursts on and off , but is more redirectable  Thought Process: more organized and less tangential   Orientation:  Full (Time, Place, and Person)  Thought Content:   Denies hallucinations, no delusions  Suicidal Thoughts:  No at this time denies any thoughts of hurting self and  contracts for safety on unit   Homicidal  Thoughts:  No  Memory:  Immediate;   Fair Recent;   Fair Remote;   Fair  Judgement:  Impaired  Insight:  Shallow  Psychomotor Activity:  Variable, restless at times  Concentration:  Fair  Recall:  [place]: Fair  Akathisia:  No  Handed:  Right  AIMS (if indicated):     Assets:  Communication Skills Social Support  ADL's:  Intact  Cognition: WNL  Sleep:  Number of Hours: 3.5     Current Medications: Current Facility-Administered  Medications  Medication Dose Route Frequency Provider Last Rate Last Dose  . acetaminophen (TYLENOL) tablet 650 mg  650 mg Oral Q6H PRN Laverle Hobby, PA-C   650 mg at 08/11/14 0306  . alum & mag hydroxide-simeth (MAALOX/MYLANTA) 200-200-20 MG/5ML suspension 30 mL  30 mL Oral Q4H PRN Laverle Hobby, PA-C      . antiseptic oral rinse (BIOTENE) solution 15 mL  15 mL Mouth Rinse PRN Janett Labella, NP   15 mL at 08/10/14 2133  . divalproex (DEPAKOTE ER) 24 hr tablet 500 mg  500 mg Oral q morning - 10a Jenne Campus, MD   500 mg at 08/11/14 0955  . divalproex (DEPAKOTE ER) 24 hr tablet 750 mg  750 mg Oral QHS Jenne Campus, MD   750 mg at 08/10/14 2132  . gabapentin (NEURONTIN) capsule 400 mg  400 mg Oral TID Ursula Alert, MD   400 mg at 08/11/14 1207  . hydrocortisone 1 % ointment   Topical BID Ursula Alert, MD      . hydrOXYzine (ATARAX/VISTARIL) tablet 25 mg  25 mg Oral TID PRN Ursula Alert, MD      . ibuprofen (ADVIL,MOTRIN) tablet 600 mg  600 mg Oral Q6H PRN Laverle Hobby, PA-C   600 mg at 08/11/14 1102  . insulin aspart (novoLOG) injection 0-20 Units  0-20 Units Subcutaneous TID WC Laverle Hobby, PA-C   3 Units at 08/11/14 1209  . insulin glargine (LANTUS) injection 6 Units  6 Units Subcutaneous QHS Laverle Hobby, PA-C   6 Units at 08/10/14 2129  . LORazepam (ATIVAN) tablet 2 mg  2 mg Oral Q8H PRN Benjamine Mola, FNP   2 mg at 08/10/14 2259  . magnesium hydroxide (MILK OF MAGNESIA) suspension 30 mL  30 mL Oral Daily PRN Laverle Hobby, PA-C   30 mL at 08/08/14 0900  . metFORMIN (GLUCOPHAGE) tablet 500 mg  500 mg Oral BID WC Laverle Hobby, PA-C   500 mg at 08/11/14 0847  . nystatin (MYCOSTATIN/NYSTOP) topical powder   Topical BID Laverle Hobby, PA-C      . polyethylene glycol (MIRALAX / GLYCOLAX) packet 17 g  17 g Oral Daily Sheila May Agustin, NP   17 g at 08/11/14 8675    Lab Results:  Results for orders placed or performed during the hospital encounter of  08/03/14 (from the past 48 hour(s))  Glucose, capillary     Status: Abnormal   Collection Time: 08/09/14  5:04 PM  Result Value Ref Range   Glucose-Capillary 167 (H) 70 - 99 mg/dL  Glucose, capillary     Status: Abnormal   Collection Time: 08/09/14  8:45 PM  Result Value Ref Range   Glucose-Capillary 195 (H) 70 - 99 mg/dL  Valproic acid level     Status: None   Collection Time: 08/10/14  6:20 AM  Result Value Ref Range   Valproic Acid  Lvl 63.0 50.0 - 100.0 ug/mL    Comment: Performed at [facility]  Glucose, capillary     Status: Abnormal   Collection Time: 08/10/14  7:56 AM  Result Value Ref Range   Glucose-Capillary 144 (H) 70 - 99 mg/dL  Glucose, capillary     Status: Abnormal   Collection Time: 08/10/14 11:46 AM  Result Value Ref Range   Glucose-Capillary 141 (H) 70 - 99 mg/dL  Glucose, capillary     Status: Abnormal   Collection Time: 08/10/14  5:04 PM  Result Value Ref Range   Glucose-Capillary 178 (H) 70 - 99 mg/dL  Glucose, capillary     Status: Abnormal   Collection Time: 08/10/14  9:02 PM  Result Value Ref Range   Glucose-Capillary 118 (H) 70 - 99 mg/dL   Comment 1 Notify RN   Glucose, capillary     Status: Abnormal   Collection Time: 08/11/14  6:18 AM  Result Value Ref Range   Glucose-Capillary 118 (H) 70 - 99 mg/dL    Physical Findings: AIMS: Facial and Oral Movements Muscles of Facial Expression: None, normal Lips and Perioral Area: None, normal Jaw: None, normal Tongue: None, normal,Extremity Movements Upper (arms, wrists, hands, fingers): None, normal Lower (legs, knees, ankles, toes): None, normal, Trunk Movements Neck, shoulders, hips: None, normal, Overall Severity Severity of abnormal movements (highest score from questions above): None, normal Incapacitation due to abnormal movements: None, normal Patient's awareness of abnormal movements (rate only patient's report): No Awareness, Dental Status Current problems with teeth and/or dentures?:  Yes Does patient usually wear dentures?: No  CIWA:  CIWA-Ar Total: 0 COWS:     Assessment: Remains labile and loud, agitated at times, but overall has had longer periods of being calm, and is becoming more redirectable by staff. Patient with improvement since admission , is not as tangential as admission. Pt with more organized thought process.    Treatment Plan Summary: Daily contact with patient to assess and evaluate symptoms and progress in treatment and Medication management   Continue Depakote ER  500 mgs QAM and 750 mgs QHS . Could get another Depakote level on 08/13/14. Continue  Neurontin to 400 mg TID.Vistaril 25 mg po tid prn for anxiety. DC ed seroquel due to prolonged QTC. Serial EKGs done since admission , her QTC seems to be getting prolonged with the use of medications like antipsychotics. Pt to participate in therapeutic milieu.   Medical Decision Making:  Established Problem, Stable/Improving (1), Review of Psycho-Social Stressors (1), Review or order clinical lab tests (1), Review of Last Therapy Session (1), Review of Medication Regimen & Side Effects (2) and Review of New Medication or Change in Dosage (2)     , MD 08/11/2014, 12:30 PM

## 2014-08-11 NOTE — Tx Team (Signed)
  Interdisciplinary Treatment Plan Update   Date Reviewed:  08/11/2014  Time Reviewed:  8:31 AM  Progress in Treatment:   Attending groups: Yes Participating in groups: Yes Taking medication as prescribed: Yes  Tolerating medication: Yes Family/Significant other contact made: Yes  Patient understands diagnosis: Yes  Discussing patient identified problems/goals with staff: Yes  See initial care plan Medical problems stabilized or resolved: Yes Denies suicidal/homicidal ideation: Yes  In tx team Patient has not harmed self or others: Yes  For review of initial/current patient goals, please see plan of care.  Estimated Length of Stay:  Likely d/c tomorrow  Reason for Continuation of Hospitalization:   New Problems/Goals identified:  N/A  Discharge Plan or Barriers:   return home, follow up outpt  Additional Comments:  Pt remains symptomatic  Attendees:  Signature: Ivin BootySarama Eappen, MD 08/11/2014 8:31 AM   Signature: Richelle Itood Jenika Chiem, LCSW 08/11/2014 8:31 AM  Signature: Fransisca KaufmannLaura Davis, NP 08/11/2014 8:31 AM  Signature: Marzetta Boardhrista Dopson, RN 08/11/2014 8:31 AM  Signature:  08/11/2014 8:31 AM  Signature:  08/11/2014 8:31 AM  Signature:   08/11/2014 8:31 AM  Signature:    Signature:    Signature:    Signature:    Signature:    Signature:      Scribe for Treatment Team:   Richelle Itood Aracelis Ulrey, LCSW  08/11/2014 8:31 AM

## 2014-08-11 NOTE — Progress Notes (Signed)
  [facility]  Will you be returning to the same living situation after discharge:  Yes,  home At discharge, do you have transportation home?: Yes,  husband Do you have the ability to pay for your medications: Yes,  mental health  Release of information consent forms completed and in the chart;  Patient's signature needed at discharge.  Patient to Follow up at: Follow-up Information    [facility] 08/24/2014.   Why:  Monday at 11:15 with Kipp LaurenceSylvia Hairston   Contact information:   [address]  [place]  [phone]      Patient denies SI/HI: Yes,  yes    Safety Planning and Suicide Prevention discussed: Yes,  yes  Have you used any form of tobacco in the last 30 days? (Cigarettes, Smokeless Tobacco, Cigars, and/or Pipes): No  Has patient been referred to the Quitline?: N/A patient is not a smoker  [place], Bladen Umar B 08/11/2014, 3:57 PM

## 2014-08-11 NOTE — [facility] Group Notes (Signed)
[facility] Group Notes:  (Nursing/MHT/Case Management/Adjunct)  Date:  08/11/2014  Time:  11:14 AM  Type of Therapy:  Nurse Education  Participation Level:  Active  Participation Quality:  Appropriate and Attentive  Affect:  Appropriate  Cognitive:  Disorganized  Insight:  Lacking  Engagement in Group:  Engaged  Modes of Intervention:  Discussion and Education   Summary of Progress/Problems: The purpose of this group is to discuss the topic of the day which is Recovery. The patient attended group and she shared stories and reported that recovery to her means taking her medications. Patient kept taking her shoes on and off during group.  Branden Shallenberger E 08/11/2014, 11:14 AM

## 2014-08-11 NOTE — Progress Notes (Signed)
Adult Psychoeducational Group Note  Date:  08/11/2014 Time:  1:23 AM  Group Topic/Focus:  Wrap-Up Group:   The focus of this group is to help patients review their daily goal of treatment and discuss progress on daily workbooks.  Participation Level:  Active  Participation Quality:  Monopolizing and Redirectable  Affect:  Labile  Cognitive:  Disorganized and Delusional  Insight: Improving  Engagement in Group:  Engaged  Modes of Intervention:  Discussion  Additional Comments:  Pt attended group, however was off topic and asking inappropriate questions at times and had to be redirected several times. Pt stated she is ready to get out of here, but she is glad she came because it is helping her. Pt stated tomorrow she wanted to work on listening better and not exploding when things don't go her way and to work on herself.  Everrett Coombewen, Devita Nies C 08/11/2014, 1:23 AM

## 2014-08-11 NOTE — Progress Notes (Signed)
Patient ID: Cindy PearVirginia Bird, female   DOB: August 30, 1970, 44 y.o.   MRN: [id number] She has been up most of the day in room this AM and got Mad at housekeeping because pt had put cloth in toilet and then would not get it out when house keeping asked her to.  Spoke to her and she said that she had removed the cloth but then she was in th process of taking kleenex and a cup out  Saying that she was cleaning up the housekepers mess. She did calm  Down and has been calm remainder of time.  Has requested and received prn medication for pain that has been effective.  Her plan is to take medications, alinebehavior and attitude to have a wholesome response to all staff members.

## 2014-08-12 ENCOUNTER — Encounter (HOSPITAL_COMMUNITY): Payer: Self-pay | Admitting: Psychiatry

## 2014-08-12 DIAGNOSIS — F1994 Other psychoactive substance use, unspecified with psychoactive substance-induced mood disorder: Secondary | ICD-10-CM | POA: Diagnosis present

## 2014-08-12 DIAGNOSIS — F19929 Other psychoactive substance use, unspecified with intoxication, unspecified: Secondary | ICD-10-CM | POA: Diagnosis present

## 2014-08-12 LAB — GLUCOSE, CAPILLARY: GLUCOSE-CAPILLARY: 122 mg/dL — AB (ref 70–99)

## 2014-08-12 MED ORDER — METFORMIN HCL 500 MG PO TABS: 500.0000 mg | ORAL_TABLET | Freq: Two times a day (BID) | ORAL | Status: AC

## 2014-08-12 MED ORDER — INSULIN GLARGINE 100 UNIT/ML ~~LOC~~ SOLN: 6.0000 [IU] | Freq: Every day | SUBCUTANEOUS | Status: AC

## 2014-08-12 MED ORDER — GABAPENTIN 400 MG PO CAPS: 400.0000 mg | ORAL_CAPSULE | Freq: Three times a day (TID) | ORAL | Status: AC

## 2014-08-12 MED ORDER — DIVALPROEX SODIUM ER 250 MG PO TB24: ORAL_TABLET | ORAL | Status: AC

## 2014-08-12 MED ORDER — HYDROXYZINE HCL 25 MG PO TABS: 25.0000 mg | ORAL_TABLET | Freq: Three times a day (TID) | ORAL | Status: AC | PRN

## 2014-08-12 NOTE — Discharge Summary (Signed)
Physician Discharge Summary Note  Patient:  Cindy Bird is an 44 y.o., female MRN:  [id number] DOB:  [birth year] Patient phone:  [phone] (home)  Patient address:   [address] [place] [address],  Total Time spent with patient: 30 minutes  Date of Admission:  08/03/2014 Date of Discharge: 08/12/2014  Reason for Admission:  Psychosis  Principal Problem: Substance or medication-induced bipolar and related disorder with onset during intoxication Discharge Diagnoses: Patient Active Problem List   Diagnosis Date Noted  . Substance or medication-induced bipolar and related disorder with onset during intoxication [F19.94] 08/12/2014  . QT prolongation [I45.81] 08/11/2014  . Fracture of ulnar styloid [S52.613A] 08/06/2014  . DM (diabetes mellitus) [E11.9] 08/06/2014  . Cannabis use disorder, severe, dependence [F12.20] 08/04/2014    Musculoskeletal: Strength & Muscle Tone: within normal limits Gait & Station: normal Patient leans: N/A  Psychiatric Specialty Exam: Physical Exam  Vitals reviewed. Psychiatric: Her speech is normal and behavior is normal. Judgment and thought content normal. Her mood appears anxious. Cognition and memory are normal.    Review of Systems  Constitutional: Negative.   HENT: Negative.   Eyes: Negative.   Respiratory: Negative.   Cardiovascular: Negative.   Gastrointestinal: Negative.   Genitourinary: Negative.   Musculoskeletal: Negative.   Skin: Negative.   Neurological: Negative.   Endo/Heme/Allergies: Negative.   Psychiatric/Behavioral: Depression: rates 3/10. The patient is nervous/anxious.     Blood pressure 101/77, pulse 102, temperature 98.4 F (36.9 C), temperature source Oral, resp. rate 20, height  (1.6 m), weight 105.235 kg (232 lb), last menstrual period 07/04/2014.Body mass index is 41.11 kg/(m^2).    Past Medical History: History reviewed. No pertinent past medical history. History reviewed. No pertinent past surgical  history. Family History:  Family History  Problem Relation Age of Onset  . Cerebral palsy Brother    Social History:  History  Alcohol Use No     History  Drug Use  . Yes  . Special: Marijuana    Comment: today    History   Social History  . Marital Status: Married    Spouse Name: N/A  . Number of Children: N/A  . Years of Education: N/A   Social History Main Topics  . Smoking status: Never Smoker   . Smokeless tobacco: Not on file  . Alcohol Use: No  . Drug Use: Yes    Special: Marijuana     Comment: today  . Sexual Activity: Yes    Birth Control/ Protection: None   Other Topics Concern  . None   Social History Narrative    Past Psychiatric History: Hospitalizations:  Says that she was hospitalized before  Outpatient Care:  Substance Abuse Care:  Self-Mutilation:  Suicidal Attempts:  Violent Behaviors:   Risk to Self: Is patient at risk for suicide?: No Risk to Others:   Prior Inpatient Therapy:   Prior Outpatient Therapy:    Level of Care:  OP  Hospital Course:  [place] is a 44 year old Caucasian female initially seen at [facility] for disorientation, disorganized speech, tangential speech.  She described riding in the car with her husband, picking up a child named Aneta Mins from a neighbor.  She went on to say she heard the radio, a black lady singing, she became sad and started crying.  "When I got into this house, Ms. Corrie Dandy was sitting down with medicines in front of her. I did not know if the medicines were hers or phillips. When I picked phillip  up, I did not bring him back on the agreed time. All she could have done is to call my home, or called the police to call me. I like people and I enjoy talking to people a lot".  [place] Milligan was admitted for Substance or medication-induced bipolar and related disorder with onset during intoxication and crisis management.  She was treated with Depakote for mood stabilization.   She continued to have disorganized  speech.  She had paranoia about care being given to her.  She was observed to have disruptive outbursts on th unit.  She says she has not been in this hospital in the past.  Some gradual improvement noted in her affect and moods.    Medical problems were identified and treated as needed.  Home medications were restarted as appropriate.  Improvement was monitored by observation and [place] daily report of symptom reduction.  Emotional and mental status was monitored by daily self inventory reports completed by [place] and clinical staff.         [place] Soutar was evaluated by the treatment team for stability and plans for continued recovery upon discharge.  She was offered further treatment options upon discharge including Residential, Intensive Outpatient and Outpatient treatment.  She will follow up with [facility] for medication management and counseling.     [name] motivation was an integral factor for scheduling further treatment.  Employment, transportation, bed availability, health status, family support, and any pending legal issues were also considered during her hospital stay.  Upon completion of this admission the patient was both mentally and medically stable for discharge denying suicidal/homicidal ideation, auditory/visual/tactile hallucinations, delusional thoughts and paranoia.       Consults:  psychiatry  Significant Diagnostic Studies:  labs: per ED  Discharge Vitals:   Blood pressure 101/77, pulse 102, temperature 98.4 F (36.9 C), temperature source Oral, resp. rate 20, height 5\' 3"  (1.6 m), weight 105.235 kg (232 lb), last menstrual period 07/04/2014. Body mass index is 41.11 kg/(m^2). Lab Results:   Results for orders placed or performed during the hospital encounter of 08/03/14 (from the past 72 hour(s))  Glucose, capillary     Status: Abnormal   Collection Time: 08/09/14  5:04 PM  Result Value Ref Range    Glucose-Capillary 167 (H) 70 - 99 mg/dL  Glucose, capillary     Status: Abnormal   Collection Time: 08/09/14  8:45 PM  Result Value Ref Range   Glucose-Capillary 195 (H) 70 - 99 mg/dL  Valproic acid level     Status: None   Collection Time: 08/10/14  6:20 AM  Result Value Ref Range   Valproic Acid Lvl 63.0 50.0 - 100.0 ug/mL    Comment: Performed at [facility]  Glucose, capillary     Status: Abnormal   Collection Time: 08/10/14  7:56 AM  Result Value Ref Range   Glucose-Capillary 144 (H) 70 - 99 mg/dL  Glucose, capillary     Status: Abnormal   Collection Time: 08/10/14 11:46 AM  Result Value Ref Range   Glucose-Capillary 141 (H) 70 - 99 mg/dL  Glucose, capillary     Status: Abnormal   Collection Time: 08/10/14  5:04 PM  Result Value Ref Range   Glucose-Capillary 178 (H) 70 - 99 mg/dL  Glucose, capillary     Status: Abnormal   Collection Time: 08/10/14  9:02 PM  Result Value Ref Range   Glucose-Capillary 118 (H) 70 - 99 mg/dL   Comment 1  Notify RN   Glucose, capillary     Status: Abnormal   Collection Time: 08/11/14  6:18 AM  Result Value Ref Range   Glucose-Capillary 118 (H) 70 - 99 mg/dL  Glucose, capillary     Status: Abnormal   Collection Time: 08/11/14 12:05 PM  Result Value Ref Range   Glucose-Capillary 127 (H) 70 - 99 mg/dL   Comment 1 Notify RN   Glucose, capillary     Status: Abnormal   Collection Time: 08/11/14  4:57 PM  Result Value Ref Range   Glucose-Capillary 153 (H) 70 - 99 mg/dL   Comment 1 Document in Chart    Comment 2 Repeat Test   Glucose, capillary     Status: Abnormal   Collection Time: 08/11/14  8:37 PM  Result Value Ref Range   Glucose-Capillary 119 (H) 70 - 99 mg/dL   Comment 1 Notify RN   Glucose, capillary     Status: Abnormal   Collection Time: 08/12/14  6:09 AM  Result Value Ref Range   Glucose-Capillary 122 (H) 70 - 99 mg/dL    Physical Findings: AIMS: Facial and Oral Movements Muscles of Facial Expression: None,  normal Lips and Perioral Area: None, normal Jaw: None, normal Tongue: None, normal,Extremity Movements Upper (arms, wrists, hands, fingers): None, normal Lower (legs, knees, ankles, toes): None, normal, Trunk Movements Neck, shoulders, hips: None, normal, Overall Severity Severity of abnormal movements (highest score from questions above): None, normal Incapacitation due to abnormal movements: None, normal Patient's awareness of abnormal movements (rate only patient's report): No Awareness, Dental Status Current problems with teeth and/or dentures?: Yes Does patient usually wear dentures?: No  CIWA:  CIWA-Ar Total: 0 COWS:      See Psychiatric Specialty Exam and Suicide Risk Assessment completed by Attending Physician prior to discharge.  Discharge destination:  Home  Is patient on multiple antipsychotic therapies at discharge:  No   Has Patient had three or more failed trials of antipsychotic monotherapy by history:  No  Recommended Plan for Multiple Antipsychotic Therapies: NA     Medication List    STOP taking these medications        ALKA SELTZER PLUS PO      TAKE these medications      Indication   divalproex 250 MG 24 hr tablet  Commonly known as:  DEPAKOTE ER  Please take 2 tabs (500 mg) in the morning.  Then take 3 tabs (750 mg) in the evening.   Indication:  Mood Stabilization     gabapentin 400 MG capsule  Commonly known as:  NEURONTIN  Take 1 capsule (400 mg total) by mouth 3 (three) times daily.   Indication:  Neuropathic Pain     hydrOXYzine 25 MG tablet  Commonly known as:  ATARAX/VISTARIL  Take 1 tablet (25 mg total) by mouth 3 (three) times daily as needed for anxiety.   Indication:  Anxiety Neurosis     insulin glargine 100 UNIT/ML injection  Commonly known as:  LANTUS  Inject 0.06 mLs (6 Units total) into the skin at bedtime.   Indication:  Type 2 Diabetes     metFORMIN 500 MG tablet  Commonly known as:  GLUCOPHAGE  Take 1 tablet (500 mg  total) by mouth 2 (two) times daily with a meal.   Indication:  Type 2 Diabetes           Follow-up Information    Follow up with [facility] On 08/24/2014.   Why:  Monday at 11:15 with [facility]   Contact information:   [address]  [place]  [phone]      Follow-up recommendations:  Activity:  as tol, diet as tol  Comments:  1.  Take all your medications as prescribed.              2.  Report any adverse side effects to outpatient provider.                       3.  Patient instructed to not use alcohol or illegal drugs while on prescription medicines.            4.  In the event of worsening symptoms, instructed patient to call 911, the crisis hotline or go to nearest emergency room for evaluation of symptoms.  Total Discharge Time: 30 min  Signed: Adonis BrookGUSTIN, Taylinn Brabant MAY, AGNP-BC 08/12/2014, 12:05 PM

## 2014-08-12 NOTE — Progress Notes (Signed)
Patient ID: Cindy PearVirginia Bird, female   DOB: 1971/06/05, 44 y.o.   MRN: [id number]  Pt currently presents with a flat affect and anxious  behavior. Per self inventory, pt rates depression at a 2, hopelessness at a "none" and anxiety 5 less "get to go home." Pt's daily goal is to "Identify my goals and continue to demonstrate use of coping skills" and they intend to do so by "identify issues, take cues and prompt towards goal." Pt reports poor sleep, fair concentration and a good appetite.   Pt provided with medications per providers orders. Pt's labs and vitals were monitored throughout the day. Pt supported emotionally and encouraged to express concerns and questions. Pt educated on medications and alternative sleeping aids. Writer demonstrated insulin injection and pt was able to verbalize steps and walk through the steps of giving herself an injection.   Pt's safety ensured with 15 minute and environmental checks. Pt currently denies SI/HI and A/V hallucinations. Pt verbally agrees to seek staff if SI/HI or A/VH occurs and to consult with staff before acting on these thoughts.

## 2014-08-12 NOTE — BHH Suicide Risk Assessment (Signed)
[facility] Discharge Suicide Risk Assessment   Demographic Factors:  Caucasian and Unemployed  Total Time spent with patient: 30 minutes  Musculoskeletal: Strength & Muscle Tone: within normal limits Gait & Station: normal Patient leans: N/A  Psychiatric Specialty Exam: Physical Exam  Review of Systems  Constitutional: Negative.   HENT: Negative.   Eyes: Negative.   Respiratory: Negative.   Cardiovascular: Negative.   Gastrointestinal: Negative.   Genitourinary: Negative.   Musculoskeletal: Negative.   Skin: Negative.   Neurological: Negative.   Psychiatric/Behavioral: Positive for substance abuse (cannabis abuse). Negative for depression, suicidal ideas and hallucinations. The patient is nervous/anxious (stable).     Blood pressure 143/77, pulse 98, temperature 98.6 F (37 C), temperature source Oral, resp. rate 18, height  (1.6 m), weight 105.235 kg (232 lb), last menstrual period 07/04/2014.Body mass index is 41.11 kg/(m^2).  General Appearance: Fairly Groomed  Patent attorney::  Fair  Speech:  Clear and Coherent409  Volume:  Normal  Mood:  anxious about discharge , but is able to cope with it  Affect:  Congruent  Thought Process:  more goal directed , less tangential , answers questions appropriately majority of times, may get detailed and circumstantial at times , but is improved a lot since admission  Orientation:  Full (Time, Place, and Person)  Thought Content:  WDL  Suicidal Thoughts:  No  Homicidal Thoughts:  No  Memory:  Immediate;   Fair Recent;   Fair Remote;   Fair  Judgement:  Fair  Insight:  Shallow  Psychomotor Activity:  Normal  Concentration:  Fair  Recall:  [employer] of Knowledge:Fair  Language: Fair  Akathisia:  No  Handed:  Right  AIMS (if indicated):     Assets:  Communication Skills Social Support  Sleep:  Number of Hours: 3  Cognition: WNL  ADL's:  Intact   Have you used any form of tobacco in the last 30 days? (Cigarettes, Smokeless  Tobacco, Cigars, and/or Pipes): No  Has this patient used any form of tobacco in the last 30 days? (Cigarettes, Smokeless Tobacco, Cigars, and/or Pipes) No  Mental Status Per Nursing Assessment::   On Admission:  NA  Current Mental Status by Physician: patient denies SI/HI/AH/VH  Loss Factors: Financial problems/change in socioeconomic status  Historical Factors: Impulsivity  Risk Reduction Factors:   Positive social support  Continued Clinical Symptoms:  Alcohol/Substance Abuse/Dependencies Medical Diagnoses and Treatments/Surgeries  Cognitive Features That Contribute To Risk:  Polarized thinking    Suicide Risk:  Minimal: No identifiable suicidal ideation.  Patients presenting with no risk factors but with morbid ruminations; may be classified as minimal risk based on the severity of the depressive symptoms  Principal Problem: Substance or medication-induced bipolar and related disorder with onset during intoxication- Cannabis induced  Discharge Diagnoses:  DSM 5; Primary Psychiatric Diagnosis: Cannabis induced Bipolar and related disorder ( IMPROVING)   Secondary Psychiatric Diagnosis: Cannabis use disorder ,severe    Non Psychiatric Diagnosis: Fracture Ulnar styloid -right forearm Patient Active Problem List   Diagnosis Date Noted  . Substance or medication-induced bipolar and related disorder with onset during intoxication [F19.94] 08/12/2014  . QT prolongation [I45.81] 08/11/2014  . Fracture of ulnar styloid [S52.613A] 08/06/2014  . DM (diabetes mellitus) [E11.9] 08/06/2014  . Cannabis use disorder, severe, dependence [F12.20] 08/04/2014    Follow-up Information    Follow up with [facility] On 08/24/2014.   Why:  Monday at 11:15 with Kipp Laurence   Contact information:  [address]  [place]  [phone]      Plan Of Care/Follow-up recommendations:  Activity:  No restrictions Diet:  carb modified Tests:   Patient instructed to follow up with PMD for prolonged QT - This can be done on an elective basis. Other:  follow up with after care  Is patient on multiple antipsychotic therapies at discharge:  No   Has Patient had three or more failed trials of antipsychotic monotherapy by history:  No  Recommended Plan for Multiple Antipsychotic Therapies: NA    Maelee Hoot MD 08/12/2014, 9:23 AM

## 2014-08-12 NOTE — BHH Suicide Risk Assessment (Signed)
B[facility] INPATIENT:  Family/Significant Other Suicide Prevention Education  Suicide Prevention Education:  Education Completed; No one has been identified by the patient as the family member/significant other with whom the patient will be residing, and identified as the person(s) who will aid the patient in the event of a mental health crisis (suicidal ideations/suicide attempt).  With written consent from the patient, the family member/significant other has been provided the following suicide prevention education, prior to the and/or following the discharge of the patient.  The suicide prevention education provided includes the following:  Suicide risk factors  Suicide prevention and interventions  National Suicide Hotline telephone number  [facility] assessment telephone number  [facility] Emergency Assistance 911  [facility] and/or Residential Mobile Crisis Unit telephone number  Request made of family/significant other to:  Remove weapons (e.g., guns, rifles, knives), all items previously/currently identified as safety concern.    Remove drugs/medications (over-the-counter, prescriptions, illicit drugs), all items previously/currently identified as a safety concern.  The family member/significant other verbalizes understanding of the suicide prevention education information provided.  The family member/significant other agrees to remove the items of safety concern listed above. The patient did not endorse SI at the time of admission, nor did the patient c/o SI during the stay here.  SPE not required.  However, CSW did talk to husband, Pecolia AdesWilliam Girdler, [phone], and encouraged him to take pt to ED if he becomes concerned about her behavior or symptoms again.  Daryel Geraldorth, Kayelee Herbig B 08/12/2014, 12:00 PM

## 2014-08-12 NOTE — Progress Notes (Signed)
Pt was asleep at the beginning of the shift, but toward the end of the evening group, pt got out of bed and entered the group.  Since then, pt has been up and down the hall, in and out of the dayroom.  Pt is loud and intrusive.  She is easily agitated and gets offended easily.  She has been tearful a few times, saying that no one likes her.  Pt denies SI/HI/AV.  She says she is supposed to go home tomorrow, so she is just trying to make it through the night.  Pt continues picking at the skin on her arm.  Neosporin applied and a bandage place to discourage pt from irritating skin further.  Pt makes her needs known to staff.  Support and encouragement offered.  Safety maintained with q15 minute checks.

## 2014-08-12 NOTE — Progress Notes (Signed)
Patient ID: Cindy Bird, female   DOB: 1970-10-13, 44 y.o.   MRN: [id number]  Pt discharged home with her husband. Pt was stable and appreciative. All papers and prescriptions were given and valuables returned. Verbal understanding expressed. Denies SI/HI and A/VH. Pt given opportunity to express concerns and ask questions.

## 2014-08-14 NOTE — Progress Notes (Signed)
Patient Discharge Instructions:  After Visit Summary (AVS):   Faxed to:  08/14/14 Discharge Summary Note:   Faxed to:  08/14/14 Psychiatric Admission Assessment Note:   Faxed to:  08/14/14 Suicide Risk Assessment - Discharge Assessment:   Faxed to:  08/14/14 Faxed/Sent to the Next Level Care provider:  08/14/14 [facility] @ [phone]  Jerelene ReddenSheena E [place], 08/14/2014, 4:00 PM

## 2014-09-28 ENCOUNTER — Other Ambulatory Visit (HOSPITAL_COMMUNITY): Payer: Self-pay | Admitting: Psychiatry

## 2015-08-03 IMAGING — DX DG CHEST 1V
1 series · 1 of 1 positions shown · non-contrast
Comparison: None.

CLINICAL DATA: Medical clearance

EXAM:
CHEST  1 VIEW

[chest pa]
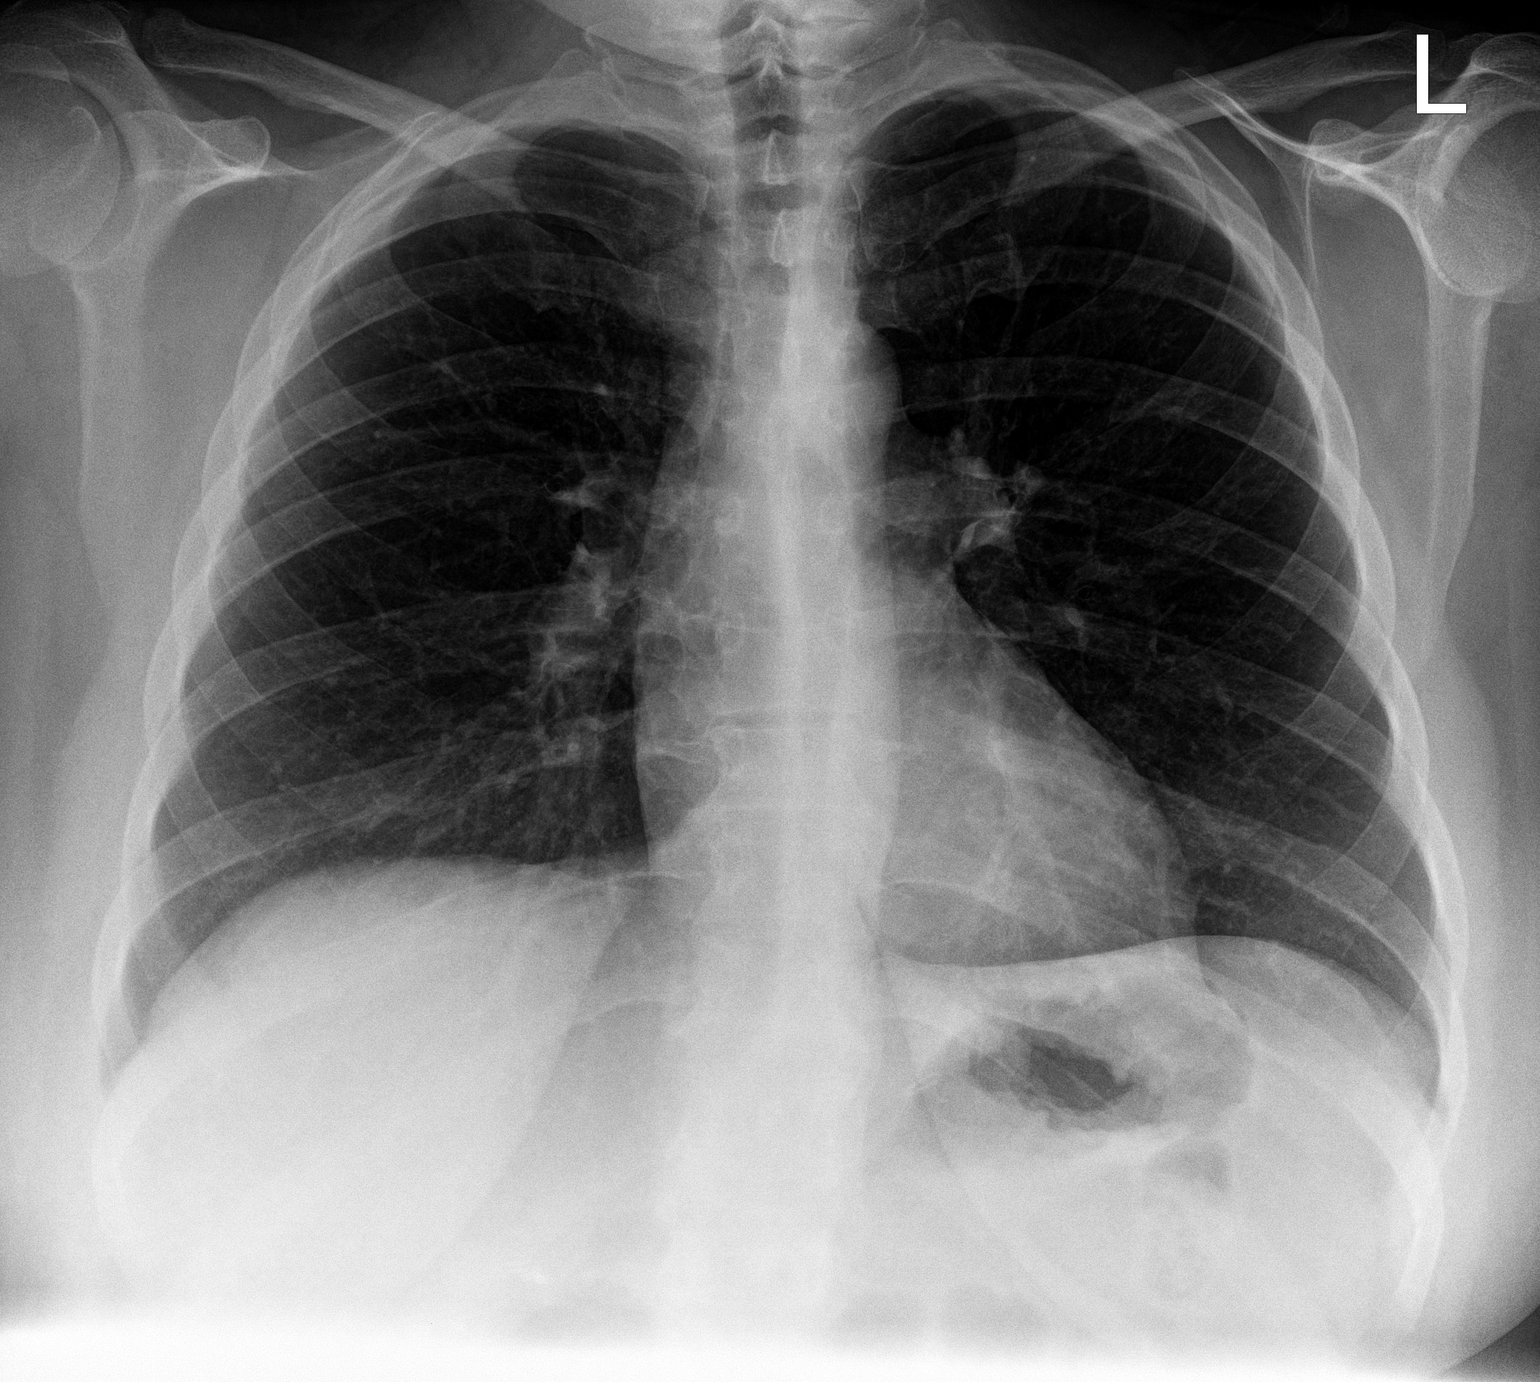

[1 of 1 positions shown; findings below may reference images not displayed]

FINDINGS: Lungs are clear.  No pleural effusion or pneumothorax.

The heart is normal in size.
IMPRESSION: No evidence of acute cardiopulmonary disease.

## 2015-08-03 IMAGING — CT CT HEAD W/O CM
1 series · 16 of 30 positions shown, 20 images · non-contrast
Comparison: None.

CLINICAL DATA: Medical clearance, [REDACTED] evaluation

EXAM:
CT HEAD WITHOUT CONTRAST
TECHNIQUE: Contiguous axial images were obtained from the base of the skull
through the vertex without intravenous contrast.

[Series 2: headseq 4.8 h37s · axial · 0.43mm/px · z∈[+65,+196]mm · 16 of 30 slices shown, 20 images]
[im 2/30  brain]
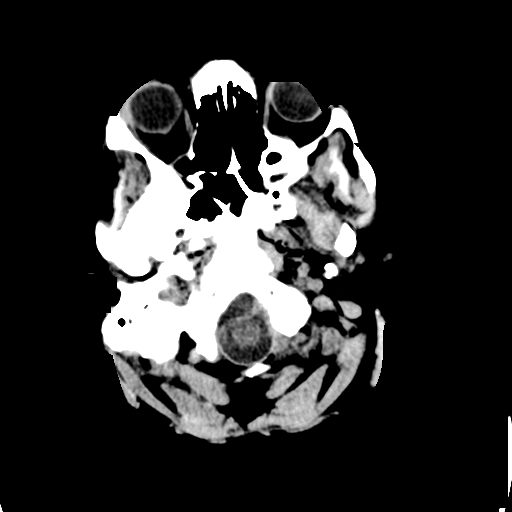
[im 2/30  bone]
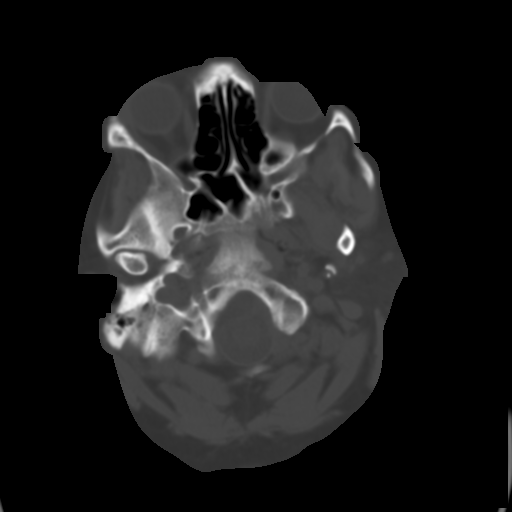
[im 4/30  brain]
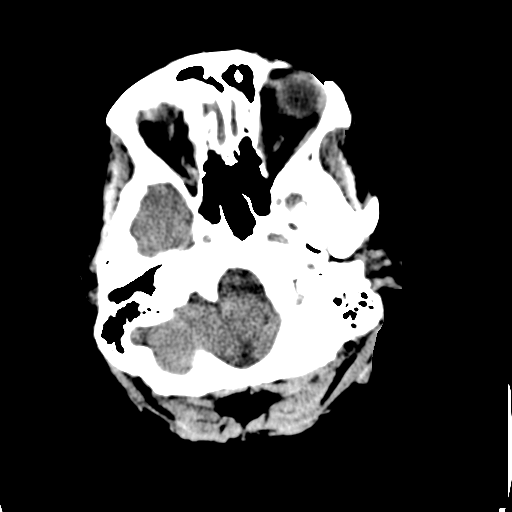
[im 6/30  brain]
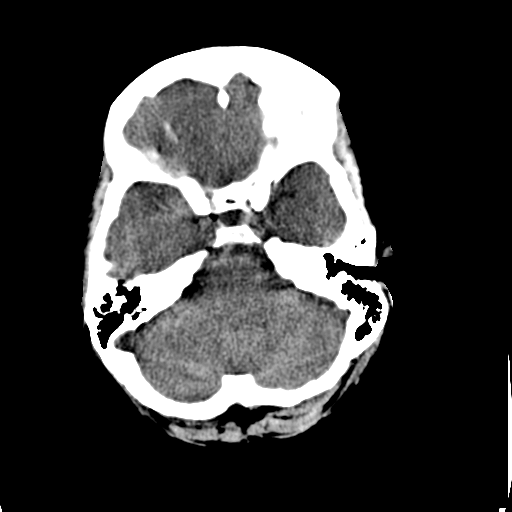
[im 8/30  brain]
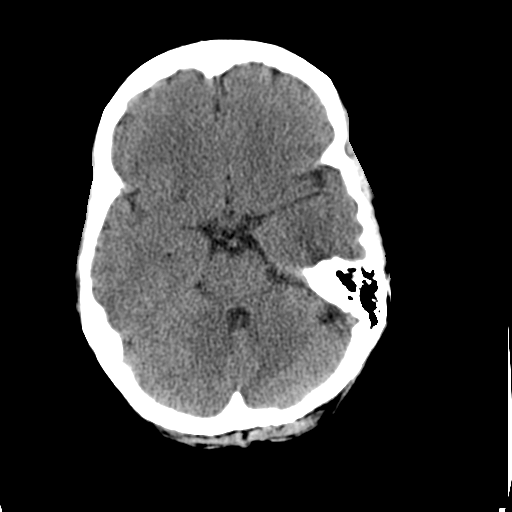
[im 9/30  brain]
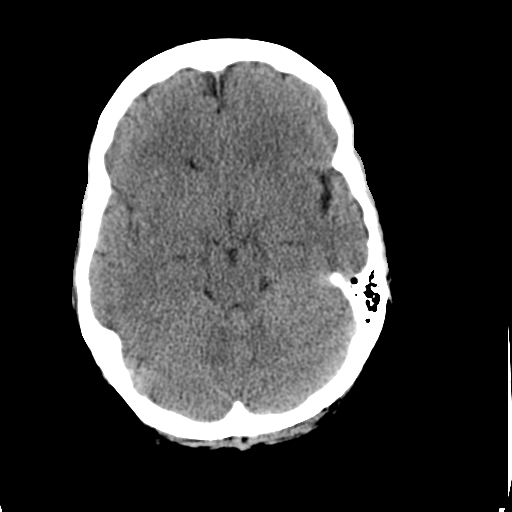
[im 9/30  bone]
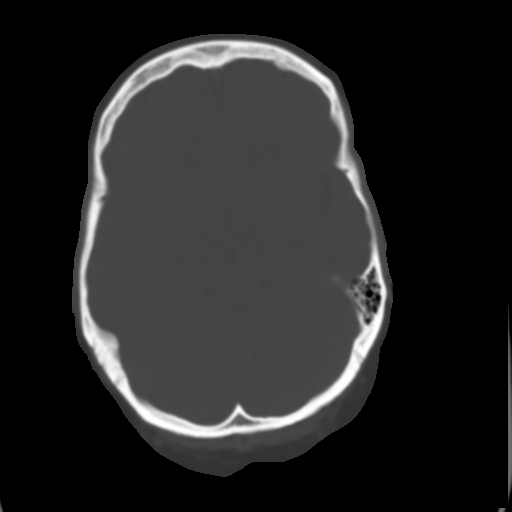
[im 11/30  brain]
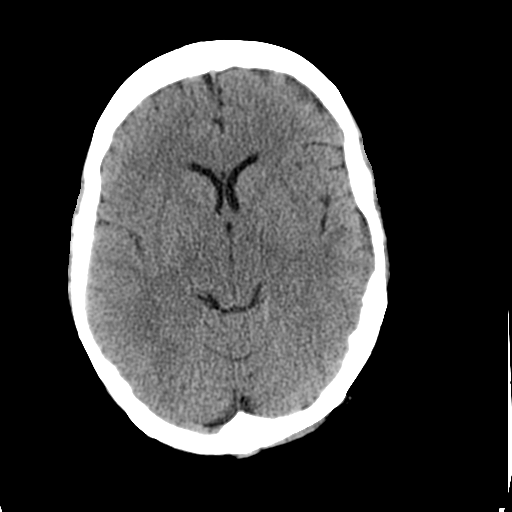
[im 13/30  brain]
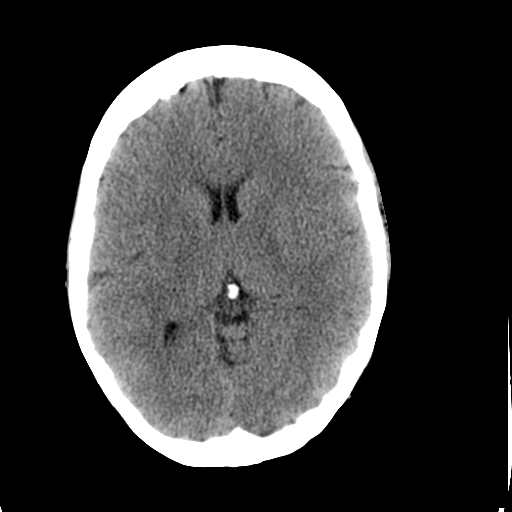
[im 15/30  brain]
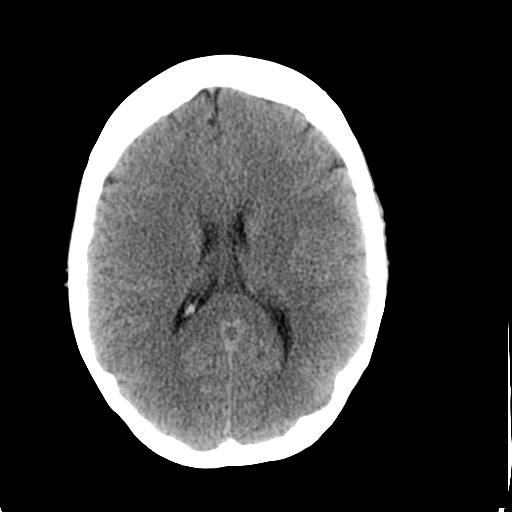
[im 16/30  brain]
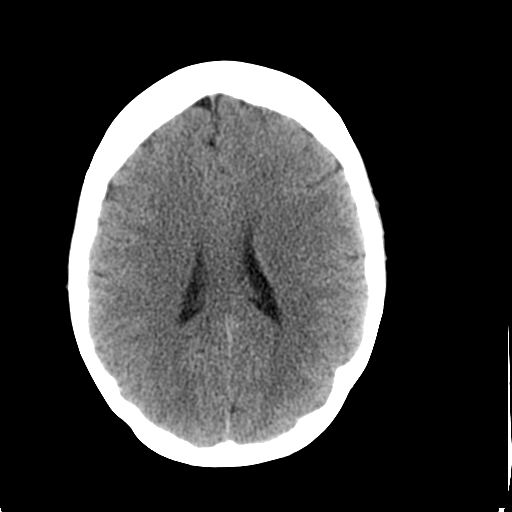
[im 16/30  bone]
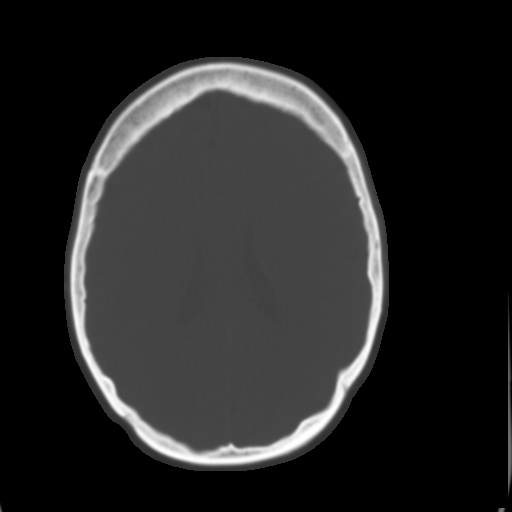
[im 18/30  brain]
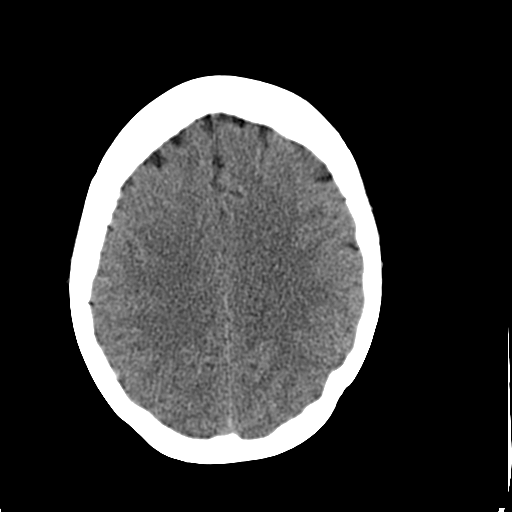
[im 20/30  brain]
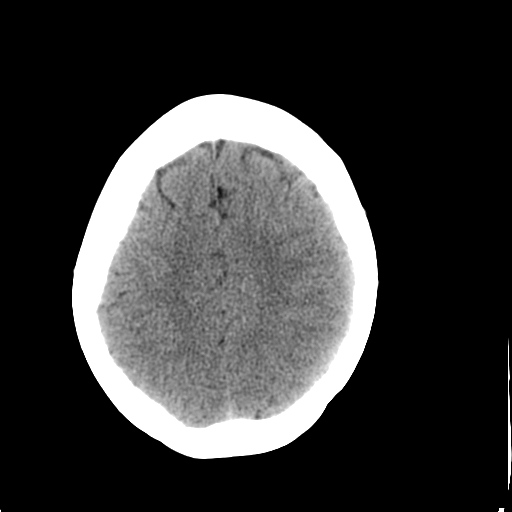
[im 22/30  brain]
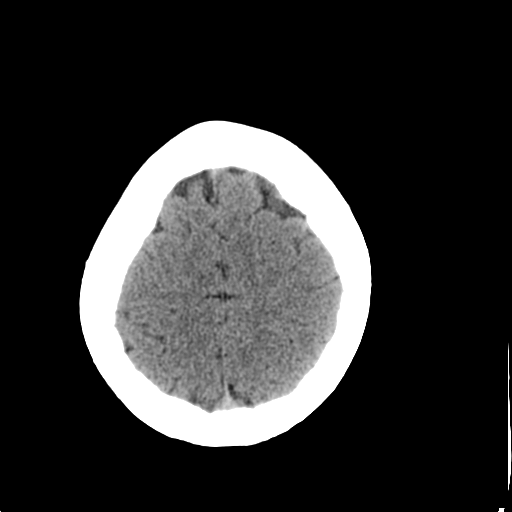
[im 23/30  brain]
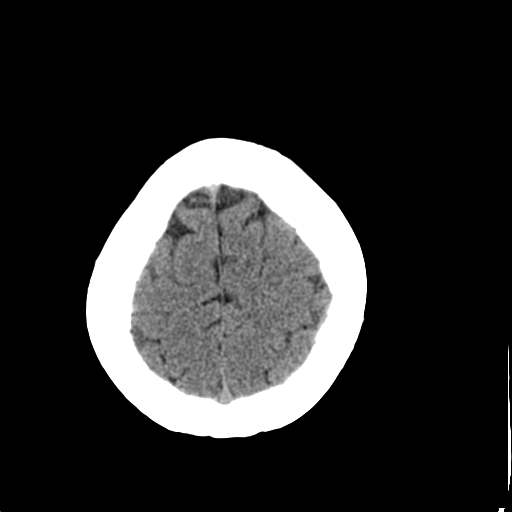
[im 23/30  bone]
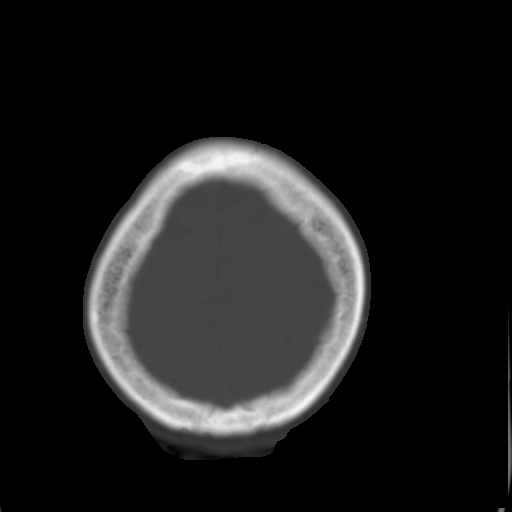
[im 25/30  brain]
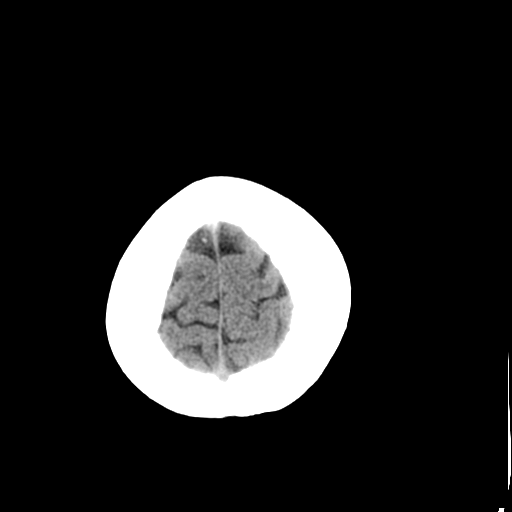
[im 27/30  brain]
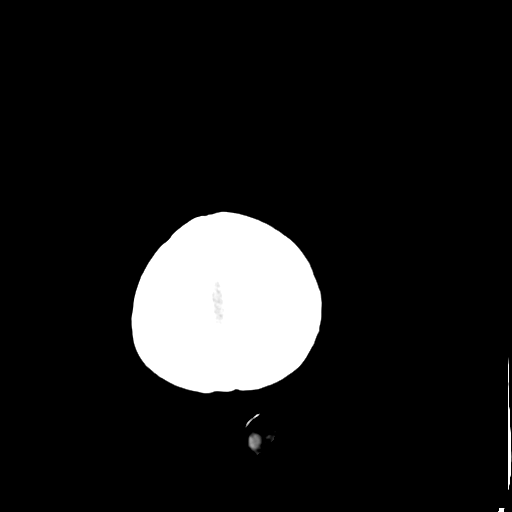
[im 29/30  brain]
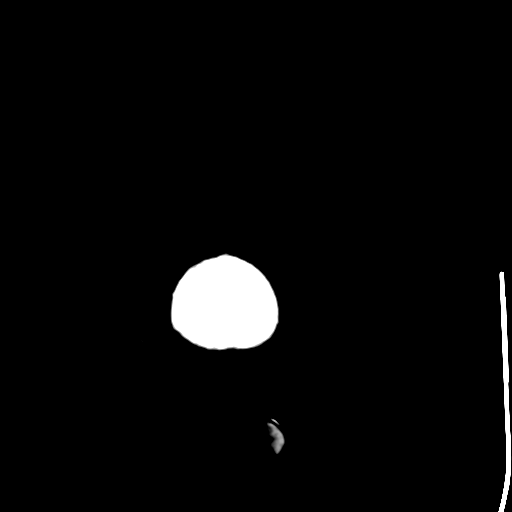

[16 of 30 positions shown; findings below may reference images not displayed]

FINDINGS: No evidence of parenchymal hemorrhage or extra-axial fluid
collection. No mass lesion, mass effect, or midline shift.

No CT evidence of acute infarction.

Cerebral volume is within normal limits.  No ventriculomegaly.

The visualized paranasal sinuses are essentially clear. Minimal
partial opacification of the right mastoid air cells.

No evidence of calvarial fracture.
IMPRESSION: Normal head CT.

## 2015-08-03 IMAGING — DX DG HAND COMPLETE 3+V*R*
3 series · 3 of 3 positions shown · non-contrast
Comparison: None.

CLINICAL DATA: Bruising and pain at the right fifth metacarpal.

EXAM:
RIGHT HAND - COMPLETE 3+ VIEW

[hand lat]
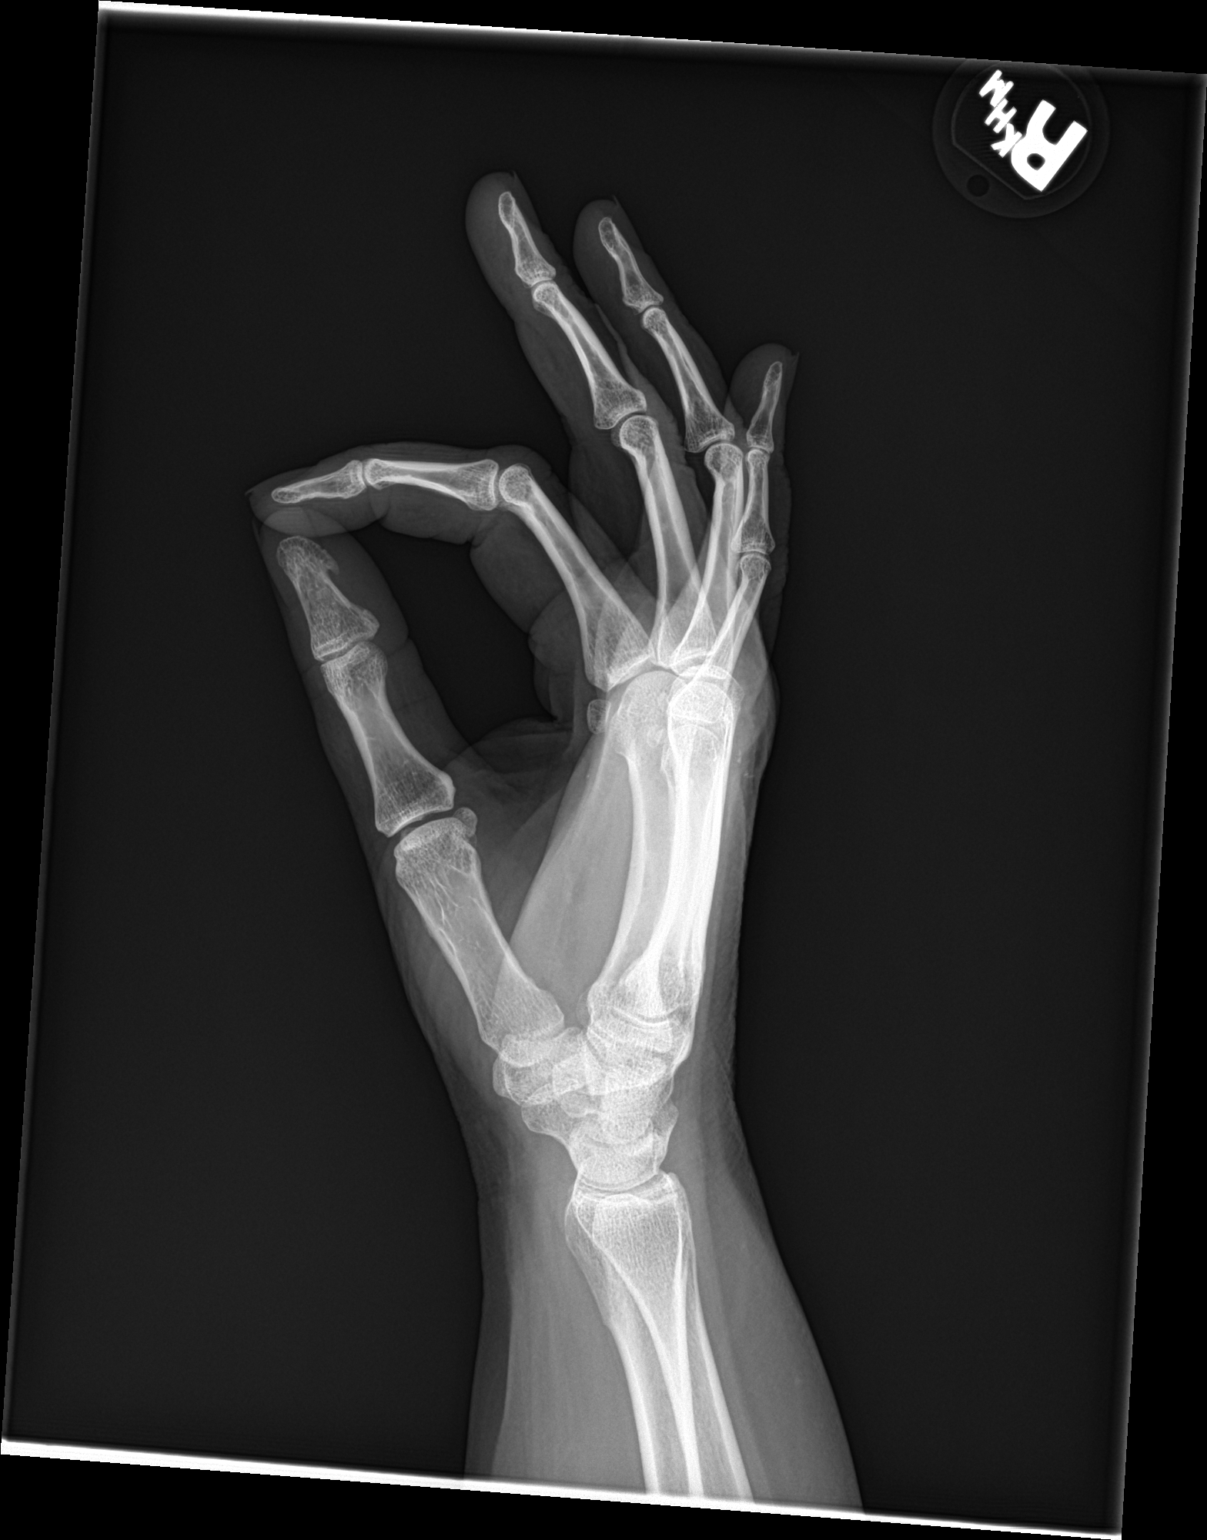

[hand pa]
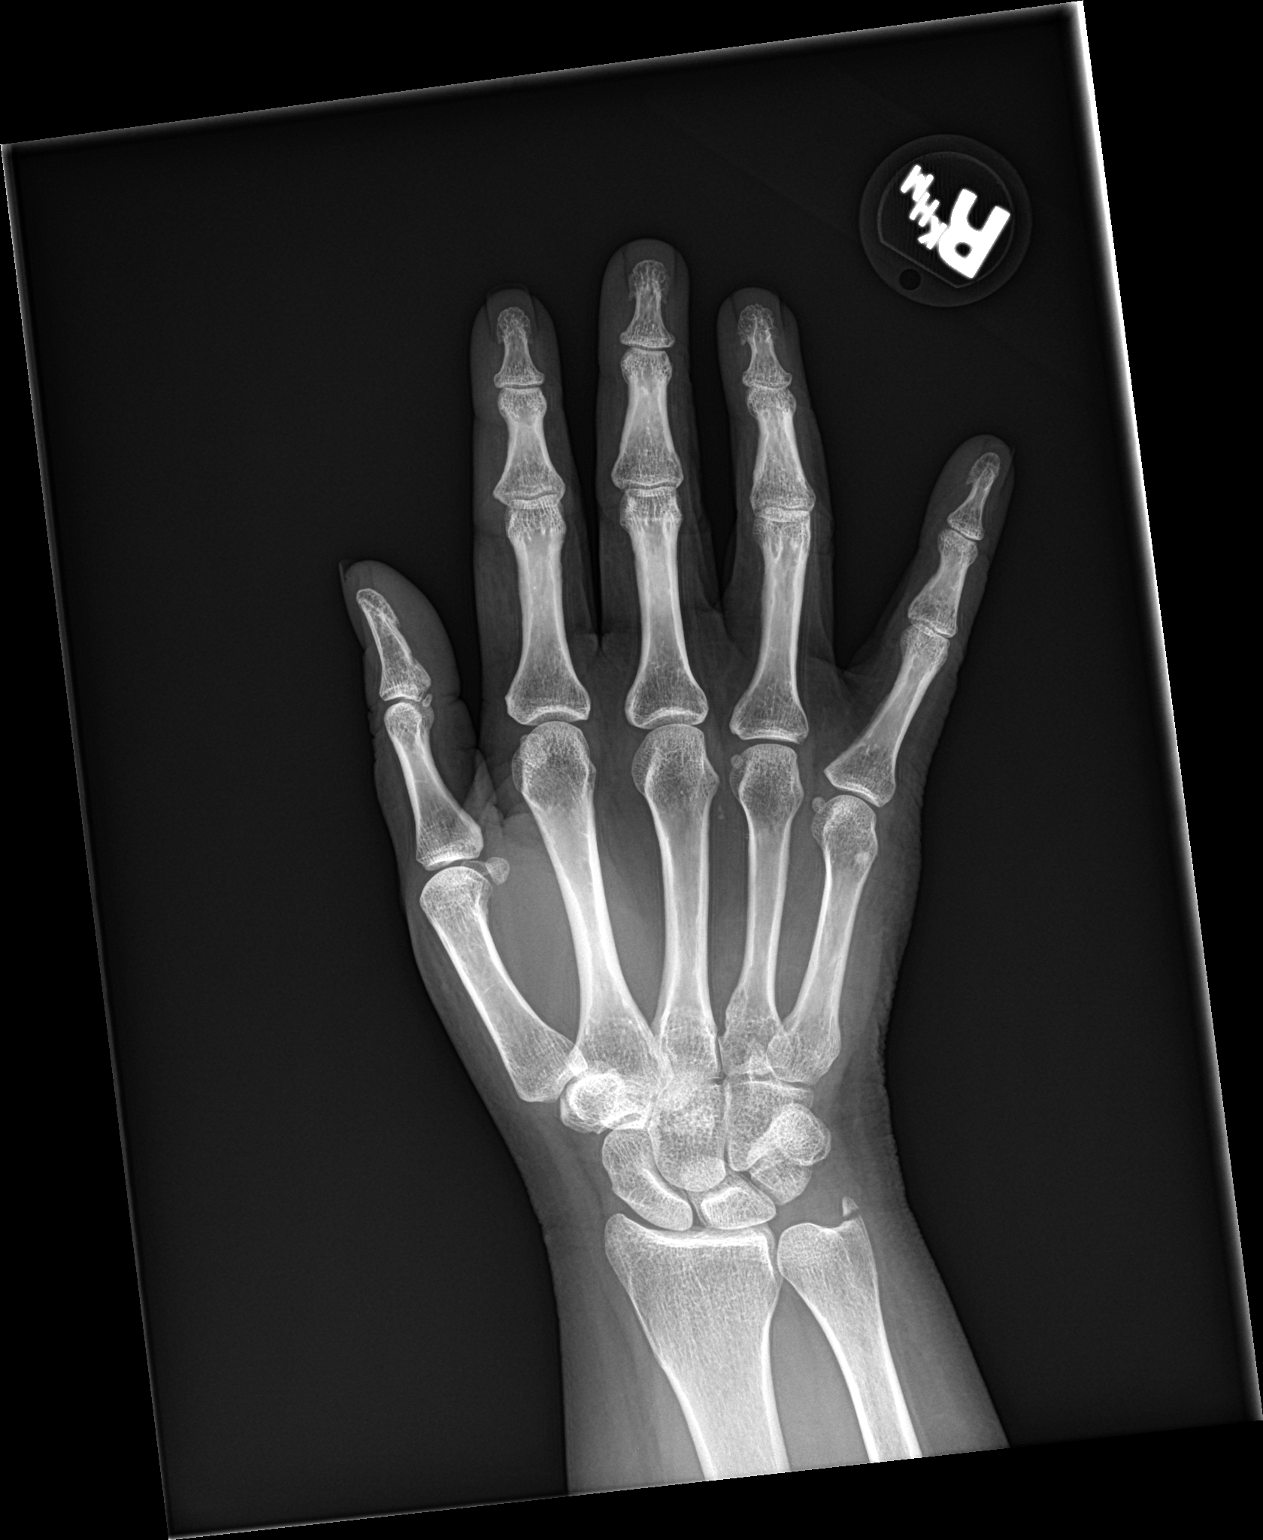

[hand obl]
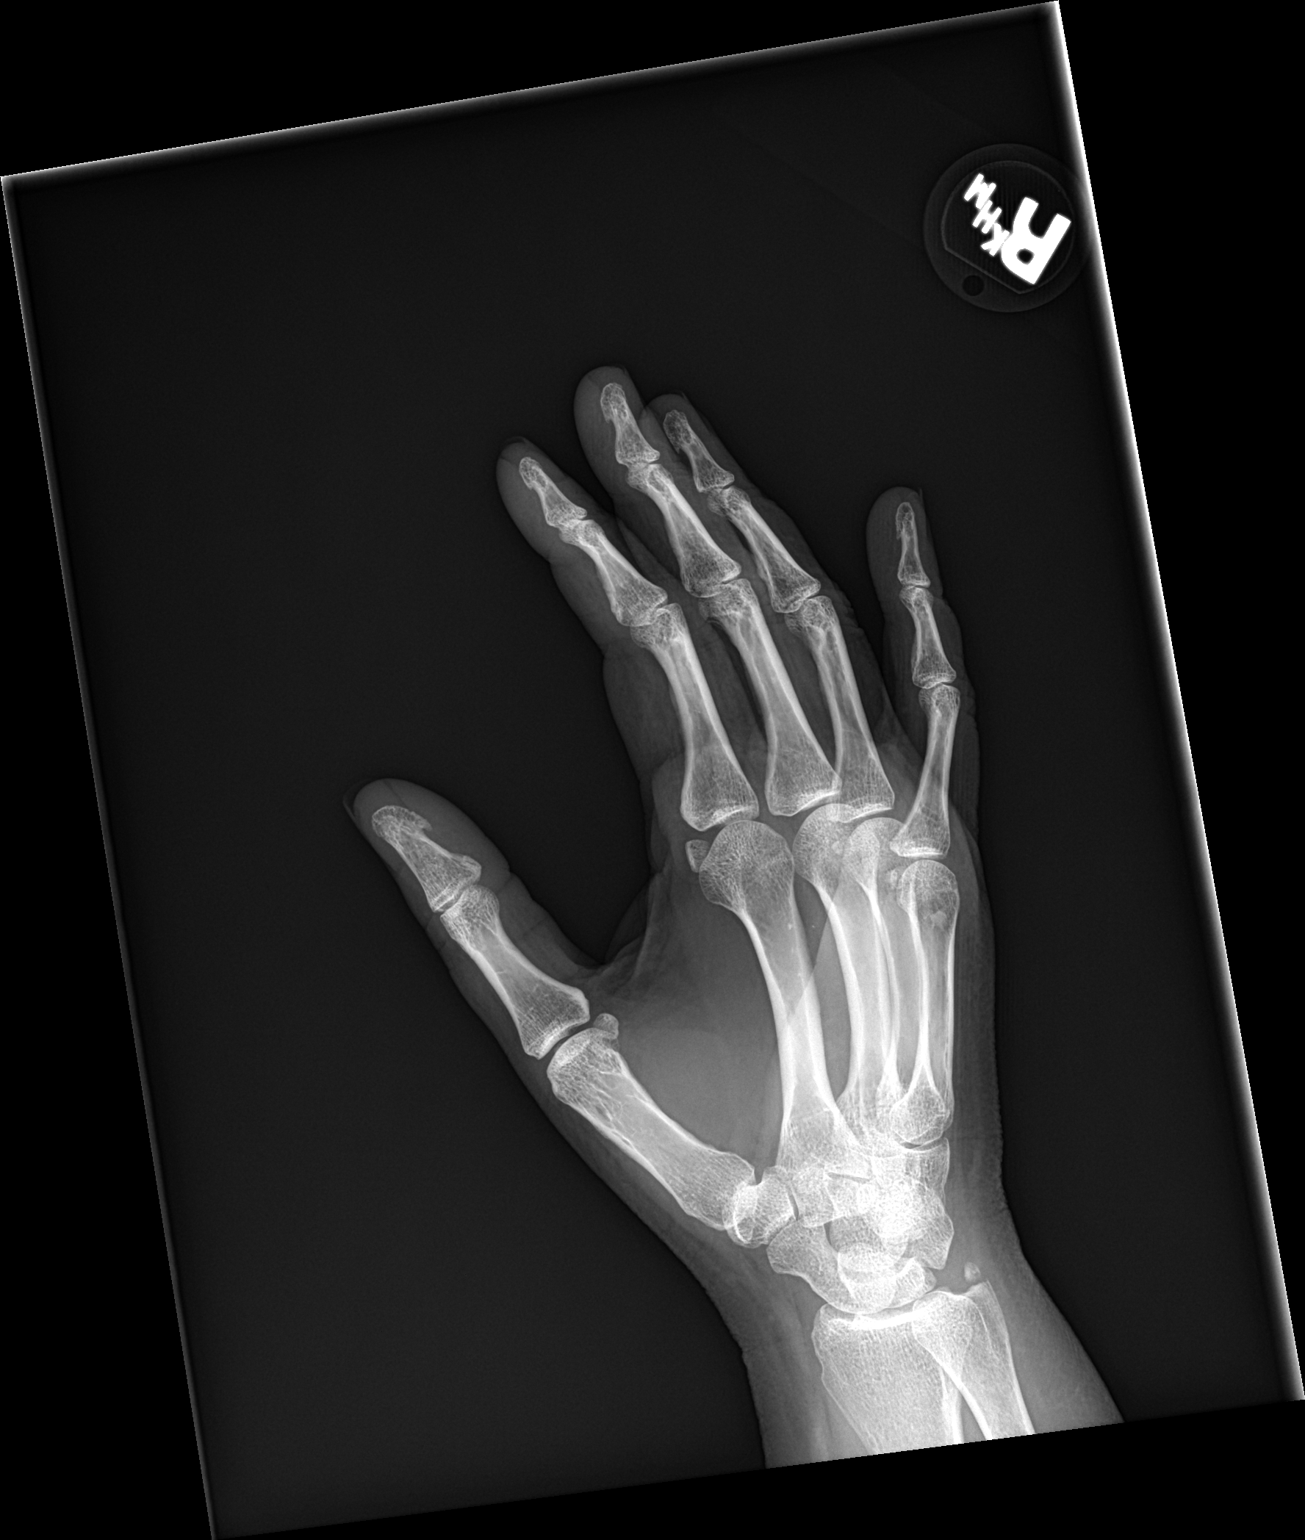

[3 of 3 positions shown; findings below may reference images not displayed]

FINDINGS: An acute appearing minimally displaced ulnar styloid avulsion
fracture is seen. No additional evidence an acute fracture.
IMPRESSION: Acute appearing ulnar styloid avulsion fracture, minimally
displaced.
